# Patient Record
Sex: Female | Born: 1980
Health system: Southern US, Community
[De-identification: ages and names within clinical notes are randomized; demographics above are authoritative.]

## PROBLEM LIST (undated history)

## (undated) DIAGNOSIS — F419 Anxiety disorder, unspecified: Secondary | ICD-10-CM

## (undated) DIAGNOSIS — F32A Depression, unspecified: Secondary | ICD-10-CM

## (undated) DIAGNOSIS — Z789 Other specified health status: Secondary | ICD-10-CM

## (undated) DIAGNOSIS — E785 Hyperlipidemia, unspecified: Secondary | ICD-10-CM

## (undated) DIAGNOSIS — F329 Major depressive disorder, single episode, unspecified: Secondary | ICD-10-CM

## (undated) DIAGNOSIS — T7491XA Unspecified adult maltreatment, confirmed, initial encounter: Secondary | ICD-10-CM

## (undated) DIAGNOSIS — E079 Disorder of thyroid, unspecified: Secondary | ICD-10-CM

## (undated) DIAGNOSIS — B977 Papillomavirus as the cause of diseases classified elsewhere: Secondary | ICD-10-CM

## (undated) HISTORY — DX: Depression, unspecified: F32.A

## (undated) HISTORY — DX: Other specified health status: Z78.9

## (undated) HISTORY — DX: Anxiety disorder, unspecified: F41.9

## (undated) HISTORY — DX: Papillomavirus as the cause of diseases classified elsewhere: B97.7

## (undated) HISTORY — DX: Major depressive disorder, single episode, unspecified: F32.9

## (undated) HISTORY — DX: Hyperlipidemia, unspecified: E78.5

## (undated) HISTORY — DX: Disorder of thyroid, unspecified: E07.9

## (undated) HISTORY — PX: NO PAST SURGERIES: SHX2092

## (undated) HISTORY — DX: Unspecified adult maltreatment, confirmed, initial encounter: T74.91XA

---

## 2001-07-28 ENCOUNTER — Other Ambulatory Visit: Admission: RE | Admit: 2001-07-28 | Discharge: 2001-07-28 | Payer: Self-pay | Admitting: Family Medicine

## 2001-11-10 ENCOUNTER — Other Ambulatory Visit: Admission: RE | Admit: 2001-11-10 | Discharge: 2001-11-10 | Payer: Self-pay | Admitting: Obstetrics & Gynecology

## 2001-12-29 ENCOUNTER — Ambulatory Visit (HOSPITAL_COMMUNITY): Admission: RE | Admit: 2001-12-29 | Discharge: 2001-12-29 | Payer: Self-pay | Admitting: Obstetrics & Gynecology

## 2002-01-11 ENCOUNTER — Emergency Department (HOSPITAL_COMMUNITY): Admission: EM | Admit: 2002-01-11 | Discharge: 2002-01-11 | Payer: Self-pay | Admitting: Emergency Medicine

## 2002-07-18 ENCOUNTER — Other Ambulatory Visit: Admission: RE | Admit: 2002-07-18 | Discharge: 2002-07-18 | Payer: Self-pay | Admitting: Obstetrics & Gynecology

## 2002-07-31 ENCOUNTER — Ambulatory Visit (HOSPITAL_COMMUNITY): Admission: RE | Admit: 2002-07-31 | Discharge: 2002-07-31 | Payer: Self-pay | Admitting: Obstetrics & Gynecology

## 2003-02-22 ENCOUNTER — Ambulatory Visit (HOSPITAL_COMMUNITY): Admission: RE | Admit: 2003-02-22 | Discharge: 2003-02-22 | Payer: Self-pay | Admitting: Family Medicine

## 2005-04-16 ENCOUNTER — Ambulatory Visit (HOSPITAL_COMMUNITY): Admission: RE | Admit: 2005-04-16 | Discharge: 2005-04-16 | Payer: Self-pay | Admitting: Family Medicine

## 2008-03-22 ENCOUNTER — Ambulatory Visit: Payer: Self-pay | Admitting: Psychiatry

## 2008-03-22 ENCOUNTER — Inpatient Hospital Stay (HOSPITAL_COMMUNITY): Admission: RE | Admit: 2008-03-22 | Discharge: 2008-03-25 | Payer: Self-pay | Admitting: Psychiatry

## 2008-03-22 ENCOUNTER — Emergency Department (HOSPITAL_COMMUNITY): Admission: EM | Admit: 2008-03-22 | Discharge: 2008-03-22 | Payer: Self-pay | Admitting: Emergency Medicine

## 2009-03-27 ENCOUNTER — Other Ambulatory Visit: Admission: RE | Admit: 2009-03-27 | Discharge: 2009-03-27 | Payer: Self-pay | Admitting: Obstetrics & Gynecology

## 2010-03-31 ENCOUNTER — Other Ambulatory Visit: Payer: Self-pay | Admitting: Obstetrics & Gynecology

## 2010-03-31 ENCOUNTER — Other Ambulatory Visit (HOSPITAL_COMMUNITY)
Admission: RE | Admit: 2010-03-31 | Discharge: 2010-03-31 | Disposition: A | Discharge status: Home / Self Care | Payer: BC Managed Care – PPO | Source: Ambulatory Visit | Attending: Obstetrics & Gynecology | Admitting: Obstetrics & Gynecology

## 2010-03-31 DIAGNOSIS — Z113 Encounter for screening for infections with a predominantly sexual mode of transmission: Secondary | ICD-10-CM | POA: Insufficient documentation

## 2010-03-31 DIAGNOSIS — Z01419 Encounter for gynecological examination (general) (routine) without abnormal findings: Secondary | ICD-10-CM | POA: Insufficient documentation

## 2010-04-30 LAB — BASIC METABOLIC PANEL
BUN: 11 mg/dL (ref 6–23)
Calcium: 9 mg/dL (ref 8.4–10.5)
Creatinine, Ser: 0.46 mg/dL (ref 0.4–1.2)
GFR calc non Af Amer: >60 mL/min (ref >60)

## 2010-04-30 LAB — TSH: TSH: 0.004 u[IU]/mL — ABNORMAL LOW (ref 0.350–4.500)

## 2010-04-30 LAB — URINALYSIS, ROUTINE W REFLEX MICROSCOPIC
Nitrite: NEGATIVE
Specific Gravity, Urine: 1.029 (ref 1.005–1.030)
Urobilinogen, UA: 1 mg/dL (ref 0.0–1.0)

## 2010-04-30 LAB — ETHANOL: Alcohol, Ethyl (B): <5 mg/dL (ref 0–10)

## 2010-04-30 LAB — PREGNANCY, URINE: Preg Test, Ur: NEGATIVE

## 2010-04-30 LAB — CBC
Platelets: 172 10*3/uL (ref 150–400)
WBC: 6.7 10*3/uL (ref 4.0–10.5)

## 2010-04-30 LAB — RAPID URINE DRUG SCREEN, HOSP PERFORMED
Opiates: NOT DETECTED
Tetrahydrocannabinol: NOT DETECTED

## 2010-04-30 LAB — T3, FREE: T3, Free: 15.8 pg/mL — ABNORMAL HIGH (ref 2.3–4.2)

## 2010-06-02 NOTE — H&P (Signed)
Anne [name], [name] NO.:  [id number]   MEDICAL RECORD NO.:  [id number]          PATIENT TYPE:  IPS   LOCATION:  0500                          FACILITY:  [facility]   PHYSICIAN:  Geoffery Lyons, M.D.      DATE OF BIRTH:  May 16, 1980   DATE OF ADMISSION:  03/22/2008  DATE OF DISCHARGE:                       PSYCHIATRIC ADMISSION ASSESSMENT   This is a voluntary admission to the services of Dr. Geoffery Lyons.   IDENTIFYING INFORMATION:  This is a 30 year old, single, white female.  She presented to the emergency department at [facility]  yesterday.  She reported that she felt suicidal.  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  She  was told that she does not know the material well enough.  She was  distraught and suicidal because of so many financial obligations.  She  denies using drugs or alcohol.  She has not been sick recently.  She has  been treated in the past for depression.  She states that last year she  was treated at the [facility] at [facility] and took Wellbutrin for  about 4 months.  She recently went back to her primary care physician,  who restarted the Wellbutrin on March 4th.   PAST PSYCHIATRIC HISTORY:  She states that her first bout of depression  was approximately 9 years ago.  She contracted genital warts, was  treated, and that resolved.  Her second bout was last year at [facility] when  she was treated with Wellbutrin, although she states she only took it  for about 4 months because she was feeling better.  Recently, she has  become depressed, anxious with crying spells, poor sleep, excessive  worry.  She states she actually stopped taking the Wellbutrin 9 months  ago, and today she does appear to have an enlarged thyroid.  We will  check her thyroid.   SOCIAL HISTORY:  She quit school she says about a week ago at [employer].  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   FAMILY HISTORY:  She states her half-sister and half-brother, their  common parent is their father, are both schizophrenic.  She states at  least she does not hear voices.   ALCOHOL AND DRUG HISTORY:  She denies.   MEDICAL HISTORY:  Significant for having been treated for genital warts  about 9 years ago.   PRIMARY CARE Anne [name]:  Anne Ruths, MD.   MEDICATIONS:  Her Wellbutrin-XL 150 mg was just restarted on the 4th.   DRUG ALLERGIES:  She has no known drug allergies.   POSITIVE PHYSICAL FINDINGS:  She was medically cleared in the ED at  [facility].  Her CBC was normal.  Her UA was normal.  She had no  alcohol.  Her urine drug screen was positive for benzos, but she has  recently been prescribed some Xanax.  She had no other lab studies.  We  will check her thyroid.  Her vital signs  on admission to the unit show  that she is 5 feet 3, weighs 154, pounds, temperature is 97, blood  pressure is 130/78, pulse is 100, respirations 18.   MENTAL STATUS EXAM:  Today, she is alert and oriented.  She was casually  groomed in her pajamas and a hospital gown. She needs to take a shower  and kind of fix up.  Her mood is anxious and labile and she cries  easily.  Her thought processes are somewhat clear, rational, and goal  oriented.  She is uncomfortable being here.  She feels guilty over  having checked herself in; however, she does acknowledge that she would  like help.  Judgment and insight are fair.  Concentration and memory are  intact.  She denies being actively suicidal or homicidal today.  She  emphatically denies having had auditory or visual hallucinations.   DIAGNOSES:   AXIS I:  1. Anxiety disorder, not otherwise specified.  2. Depressive disorder, not otherwise specified.  3. Rule out prodrome to schizophrenia.  She does have a half-sister      and brother, who are schizophrenic.   AXIS II:  Deferred.   AXIS III:  Rule out thyroid  disease.   AXIS IV:  Severe issues with economics and her Master's degree.   AXIS V:  Thirty-five.   The plan today is to increase the Wellbutrin to XL 300 mg p.o. daily.  We will change the Trazodone to Seroquel to help her sleep better, and  she can also have some during the day for anxiety.  Plus, she may be  prodromal as she does have a half-sister and brother, who are  schizophrenic.   ESTIMATED LENGTH OF STAY:  Three to 5 days, and we will have the case  manager be in touch with the [facility] on Monday.      Mickie Leonarda Salon, P.A.-C.      Geoffery Lyons, M.D.  Electronically Signed    MD/MEDQ  D:  03/23/2008  T:  03/23/2008  Job:  [id number]

## 2010-06-05 NOTE — Op Note (Signed)
   NAME:  Anne [name], Anne [name]                         ACCOUNT NO.:  [id number]   MEDICAL RECORD NO.:  [id number]                   PATIENT TYPE:  AMB   LOCATION:  DAY                                  FACILITY:  [facility]   PHYSICIAN:  Lazaro Arms, M.D.                DATE OF BIRTH:  August 14, 1980   DATE OF PROCEDURE:  07/31/2002  DATE OF DISCHARGE:  07/31/2002                                 OPERATIVE REPORT   PREOPERATIVE DIAGNOSES:  1. Recurrent cervical and vaginal condylomata.  2. Marked high-grade dysplasia on cervical biopsy.   POSTOPERATIVE DIAGNOSES:  1. Recurrent cervical and vaginal condylomata.  2. Marked high-grade dysplasia on cervical biopsy.   PROCEDURE:  Holmium laser of the cervix and vagina.   SURGEON:  Lazaro Arms, M.D.   ANESTHESIA:  Laryngeal mask airway.   FINDINGS:  The patient underwent a laser of the vulva, vagina, and cervix in  December of last year.  She had a Pap smear that subsequently came back  abnormal once again, and she was found to have recurrent condyloma of the  vagina and cervix as well as marked high-grade dysplasia on biopsy.  We then  decided to perform another laser of the vagina and cervix.   DESCRIPTION OF OPERATION:  The patient was taken to the operating room,  placed in a supine position, underwent laryngeal mask airway, placed in  dorsal lithotomy position.  She was prepped and draped in the usual sterile  fashion.  Acetic acid was used.  The holmium laser was used.  An ablation of  all the condyloma of the vagina and cervix was performed, superior and  anterior depth of 5 mm in a conical fashion on the cervix and individual  lesions were ablated as well.  I looked laboriously for any other condyloma,  and none were seen.  She did not have any of the external genitalia either.  She had a lot back in December.  The patient tolerated the procedure well.  There was minimal blood loss.  She was taken to the recovery room in good  and  stable condition.  All counts were correct.                                               Lazaro Arms, M.D.    Loraine Maple  D:  08/14/2002  T:  08/14/2002  Job:  [id number]

## 2010-06-05 NOTE — Discharge Summary (Signed)
Anne [name], Anne [name] NO.:  [id number]   MEDICAL RECORD NO.:  [id number]          PATIENT TYPE:  IPS   LOCATION:  0500                          FACILITY:  [facility]   PHYSICIAN:  Geoffery Lyons, M.D.      DATE OF BIRTH:  May 09, 1980   DATE OF ADMISSION:  03/22/2008  DATE OF DISCHARGE:  03/25/2008                               DISCHARGE SUMMARY   CHIEF COMPLAINT:  This was the first admission to [facility] for this 30 year old single white female presented to emergency  room reporting that she felt suicidal.  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  She was told that she did not know the  material well enough.  She was distraught and suicidal because of some  any financial obligations.  Denies using drugs or alcohol.  Had been  sick recently.  She had been treated in the past for depression.   PAST PSYCHIATRIC HISTORY:  Was seen at the [facility], took  Wellbutrin for about 4 months.  She recently went back to her primary  care physician to get Wellbutrin restarted.  First bout of depression 9  years prior to this admission.  Took the Wellbutrin for 4 months and  felt better so discontinued it.  Endorsed crying spells, poor sleep,  excessive worry.   SECONDARY HISTORY:  Denies active use of any substances.   MEDICAL HISTORY:  Genital warts about 9 years ago.   MEDICATION:  Wellbutrin XL 150 mg per day, just restarted.   LABORATORY WORKUP:  TSH 0.004, free T4 3.81, free T3 15.7.  CBC within  normal limits.  UA was within normal limits.   MENTAL STATUS EXAMINATION:  Reveals alert cooperative female casually  groomed and dressed and mood is anxious.  Affect is labile.  She cries.  Thought processes are clear, rational goal oriented, uncomfortable being  in the hospital.  Feeling guilty over having checked herself in.  Denies  any active suicidal or homicidal ideas.  There are no delusions,  hallucinations.  Cognition is  well-preserved.   ADMITTING DIAGNOSES:  AXIS I:  Anxiety disorder not otherwise specified.  Depressive disorder not otherwise specified.  AXIS II:  No diagnosis.  AXIS III:  Rule out hyperthyroidism.  AXIS IV:  Moderate.  AXIS V:  On admission 35-40, highest GAF in the last year 70-75.   COURSE IN THE HOSPITAL:  She was admitted.  She was kept on the  Wellbutrin that was increased to XL 300 mg per day.  Endorsed a lot of  worry and debts from school.  She had to quit the program, Master in  education.  She is working part-time.  She was overwhelmed when she came  in but endorsed that she is not anymore.  She felt that she would be  better off being seen on an outpatient basis as she felt very  uncomfortable in the unit.  She was not suicidal or homicidal and did  not have any evidence of psychoses or drug or alcohol withdrawal, we  went ahead and discharged  to outpatient followup.   DISCHARGE DIAGNOSES:  AXIS I:  Major depressive disorder, anxiety  disorder not otherwise specified.  AXIS II:  No diagnosis.  AXIS III:  Hyperthyroidism, rule out.  AXIS IV:  Moderate.  AXIS V:  On discharge 50.   DISCHARGE MEDICATIONS:  1. Wellbutrin XL 200 mg per day.  2. Seroquel 50 mg at bedtime.  3. Xanax 0.5 every 6 hours as needed for anxiety.   FOLLOWUP:  At [facility].      Geoffery Lyons, M.D.  Electronically Signed     IL/MEDQ  D:  04/23/2008  T:  04/23/2008  Job:  [id number]

## 2010-06-05 NOTE — Op Note (Signed)
   NAME:  Anne [name], Anne [name]                         ACCOUNT NO.:  [id number]   MEDICAL RECORD NO.:  [id number]                   PATIENT TYPE:  AMB   LOCATION:  SDC                                  FACILITY:  [facility]   PHYSICIAN:  Lazaro Arms, M.D.                DATE OF BIRTH:  1980/02/18   DATE OF PROCEDURE:  12/29/2001  DATE OF DISCHARGE:                                 OPERATIVE REPORT   PREOPERATIVE DIAGNOSES:  1. Cervical dysplasia.  2. Condyloma of the vagina.  3. Condyloma of the vulva and perianal region.   POSTOPERATIVE DIAGNOSES:  1. Cervical dysplasia.  2. Condyloma of the vagina.  3. Condyloma of the vulva and perianal region.   PROCEDURE:  1. Laser ablation of the cervix.  2. Laser of vaginal condyloma.  3. Laser of vulvar and perianal condyloma.   SURGEON:  Lazaro Arms, M.D.   ANESTHESIA:  1. Laryngeal mask airway general.  2. I placed a pudendal block.   FINDINGS:  The patient had multiple condyloma in the perianal region, on the  fourchette, and on the vulva.  She had some condyloma in the vagina and, of  course, she had cervical dysplasia proven by biopsy.   DESCRIPTION OF OPERATION:  The patient was taken to the operating room,  placed in supine position where she underwent laryngeal mask airway.  She  was placed in the dorsal lithotomy position, draped in the usual sterile  fashion.  Colposcopy was performed and a laser ablation of the cervix was  performed to a depth of 5 mm peripherally and 7 mm centrally.  Hemostasis  was achieved with Monsel's and the laser.  Laser ablation of the vaginal  condyloma was then performed in the usual fashion and the laser ablation  using the handheld was performed of the vulva and perianal region with a  power setting of 10.  A power setting of 20 was used for the cervix and  vagina.  The patient had been given a pudendal block for postoperative pain  10 cc bilaterally of 0.5% Marcaine plain.  The patient was awakened  from  anesthesia, taken to recovery room in good, stable condition.  All counts  correct.                                               Lazaro Arms, M.D.    Loraine Maple  D:  12/29/2001  T:  12/29/2001  Job:  [id number]

## 2010-08-03 ENCOUNTER — Other Ambulatory Visit (HOSPITAL_COMMUNITY): Payer: Self-pay | Admitting: "Endocrinology

## 2010-08-03 DIAGNOSIS — E0590 Thyrotoxicosis, unspecified without thyrotoxic crisis or storm: Secondary | ICD-10-CM

## 2010-08-05 ENCOUNTER — Encounter (HOSPITAL_COMMUNITY): Payer: Self-pay

## 2010-08-05 ENCOUNTER — Encounter (HOSPITAL_COMMUNITY)
Admission: RE | Admit: 2010-08-05 | Discharge: 2010-08-05 | Disposition: A | Discharge status: Home / Self Care | Payer: BC Managed Care – PPO | Source: Ambulatory Visit | Attending: "Endocrinology | Admitting: "Endocrinology

## 2010-08-05 DIAGNOSIS — G4700 Insomnia, unspecified: Secondary | ICD-10-CM | POA: Insufficient documentation

## 2010-08-05 DIAGNOSIS — L659 Nonscarring hair loss, unspecified: Secondary | ICD-10-CM | POA: Insufficient documentation

## 2010-08-05 DIAGNOSIS — E0590 Thyrotoxicosis, unspecified without thyrotoxic crisis or storm: Secondary | ICD-10-CM | POA: Insufficient documentation

## 2010-08-05 MED ORDER — SODIUM IODIDE I 131 CAPSULE
10.0000 | Freq: Once | INTRAVENOUS | Status: AC | PRN
  Administered 2010-08-05: 10 via ORAL

## 2010-08-06 ENCOUNTER — Encounter (HOSPITAL_COMMUNITY)
Admission: RE | Admit: 2010-08-06 | Discharge: 2010-08-06 | Disposition: A | Discharge status: Home / Self Care | Payer: BC Managed Care – PPO | Source: Ambulatory Visit | Attending: "Endocrinology | Admitting: "Endocrinology

## 2010-08-06 MED ORDER — SODIUM PERTECHNETATE TC 99M INJECTION
10.0000 | Freq: Once | INTRAVENOUS | Status: AC | PRN
  Administered 2010-08-06: 9.9 via INTRAVENOUS

## 2010-08-11 ENCOUNTER — Other Ambulatory Visit (HOSPITAL_COMMUNITY): Payer: Self-pay | Admitting: "Endocrinology

## 2010-08-11 DIAGNOSIS — E042 Nontoxic multinodular goiter: Secondary | ICD-10-CM

## 2010-08-25 ENCOUNTER — Ambulatory Visit (HOSPITAL_COMMUNITY)
Admission: RE | Admit: 2010-08-25 | Discharge: 2010-08-25 | Disposition: A | Discharge status: Home / Self Care | Payer: BC Managed Care – PPO | Source: Ambulatory Visit | Attending: "Endocrinology | Admitting: "Endocrinology

## 2010-08-25 DIAGNOSIS — E042 Nontoxic multinodular goiter: Secondary | ICD-10-CM

## 2010-08-25 DIAGNOSIS — E0590 Thyrotoxicosis, unspecified without thyrotoxic crisis or storm: Secondary | ICD-10-CM | POA: Insufficient documentation

## 2010-08-28 ENCOUNTER — Other Ambulatory Visit (HOSPITAL_COMMUNITY): Payer: Self-pay | Admitting: "Endocrinology

## 2010-08-28 DIAGNOSIS — R221 Localized swelling, mass and lump, neck: Secondary | ICD-10-CM

## 2010-09-02 ENCOUNTER — Ambulatory Visit (HOSPITAL_COMMUNITY): Admission: RE | Admit: 2010-09-02 | Payer: BC Managed Care – PPO | Source: Ambulatory Visit

## 2010-09-02 ENCOUNTER — Other Ambulatory Visit (HOSPITAL_COMMUNITY): Payer: Self-pay | Admitting: Diagnostic Radiology

## 2010-09-02 NOTE — Progress Notes (Signed)
09/02/2010 @ 1610 Discussed patient with Dr. Fransico Him.  At observed size of 11mm, the single discrete identified thyroid in the right lobe falls below the size criteria for biopsy, based on the criteria established by the Society of Radiologists in Ultrasound (referrence below).  As the nodule contains no calcifications or worrisome features, will cancel thyroid biopsy and instead proceed with a 37-month f/u US to assess.  Management of Thyroid Nodules Detected as [place]:  Society of Radiologists in Ultrasound Consensus Conference Statement.  Radiology 2005; [id number].  Available online at : [email or web address].[email or web address]/[email or web address].

## 2011-05-11 ENCOUNTER — Other Ambulatory Visit (HOSPITAL_COMMUNITY)
Admission: RE | Admit: 2011-05-11 | Discharge: 2011-05-11 | Disposition: A | Discharge status: Home / Self Care | Payer: BC Managed Care – PPO | Source: Ambulatory Visit | Attending: Obstetrics & Gynecology | Admitting: Obstetrics & Gynecology

## 2011-05-11 ENCOUNTER — Other Ambulatory Visit: Payer: Self-pay | Admitting: Obstetrics & Gynecology

## 2011-05-11 DIAGNOSIS — Z01419 Encounter for gynecological examination (general) (routine) without abnormal findings: Secondary | ICD-10-CM | POA: Insufficient documentation

## 2011-09-28 ENCOUNTER — Other Ambulatory Visit: Payer: Self-pay | Admitting: Obstetrics & Gynecology

## 2012-05-15 ENCOUNTER — Encounter: Payer: Self-pay | Admitting: *Deleted

## 2012-05-16 ENCOUNTER — Encounter: Payer: Self-pay | Admitting: Obstetrics & Gynecology

## 2012-05-16 ENCOUNTER — Ambulatory Visit (INDEPENDENT_AMBULATORY_CARE_PROVIDER_SITE_OTHER): Payer: BC Managed Care – PPO | Admitting: Obstetrics & Gynecology

## 2012-05-16 VITALS — BP 120/80 | Ht 62.0 in | Wt 209.0 lb

## 2012-05-16 DIAGNOSIS — Z01419 Encounter for gynecological examination (general) (routine) without abnormal findings: Secondary | ICD-10-CM

## 2012-05-16 MED ORDER — NORGESTIMATE-ETH ESTRADIOL 0.25-35 MG-MCG PO TABS: 1.0000 | ORAL_TABLET | Freq: Every day | ORAL | Status: DC

## 2012-05-16 NOTE — Patient Instructions (Signed)

## 2012-05-16 NOTE — Progress Notes (Signed)
Patient ID: Anne [name], female   DOB: 07/13/1980, 32 y.o.   MRN: [id number] Subjective:     Anne [name] is a 32 y.o. female here for a routine exam.  Current complaints: none.  Personal health questionnaire reviewed: not asked.   Gynecologic History Patient's last menstrual period was 05/06/2012. Contraception: OCP (estrogen/progesterone) Last Pap: 3.2013. Results were: normal Last mammogram: na. Results were: normal  Obstetric History OB History   Grav Para Term Preterm Abortions TAB SAB Ect Mult Living                   The following portions of the patient's history were reviewed and updated as appropriate: allergies, current medications, past family history, past medical history, past social history, past surgical history and problem list.  Review of Systems  Review of Systems  Constitutional: Negative for fever, chills, weight loss, malaise/fatigue and diaphoresis.  HENT: Negative for hearing loss, ear pain, nosebleeds, congestion, sore throat, neck pain, tinnitus and ear discharge.   Eyes: Negative for blurred vision, double vision, photophobia, pain, discharge and redness.  Respiratory: Negative for cough, hemoptysis, sputum production, shortness of breath, wheezing and stridor.   Cardiovascular: Negative for chest pain, palpitations, orthopnea, claudication, leg swelling and PND.  Gastrointestinal: negative for abdominal pain. Negative for heartburn, nausea, vomiting, diarrhea, constipation, blood in stool and melena.  Genitourinary: Negative for dysuria, urgency, frequency, hematuria and flank pain.  Musculoskeletal: Negative for myalgias, back pain, joint pain and falls.  Skin: Negative for itching and rash.  Neurological: Negative for dizziness, tingling, tremors, sensory change, speech change, focal weakness, seizures, loss of consciousness, weakness and headaches.  Endo/Heme/Allergies: Negative for environmental allergies and polydipsia. Does not bruise/bleed  easily.  Psychiatric/Behavioral: Negative for depression, suicidal ideas, hallucinations, memory loss and substance abuse. The patient is not nervous/anxious and does not have insomnia.        Objective:    Physical Exam  Vitals reviewed. Constitutional: She is oriented to person, place, and time. She appears well-developed and well-nourished.  HENT:  Head: Normocephalic and atraumatic.        Right Ear: External ear normal.  Left Ear: External ear normal.  Nose: Nose normal.  Mouth/Throat: Oropharynx is clear and moist.  Eyes: Conjunctivae and EOM are normal. Pupils are equal, round, and reactive to light. Right eye exhibits no discharge. Left eye exhibits no discharge. No scleral icterus.  Neck: Normal range of motion. Neck supple. No tracheal deviation present. No thyromegaly present.  Cardiovascular: Normal rate, regular rhythm, normal heart sounds and intact distal pulses.  Exam reveals no gallop and no friction rub.   No murmur heard. Respiratory: Effort normal and breath sounds normal. No respiratory distress. She has no wheezes. She has no rales. She exhibits no tenderness.  GI: Soft. Bowel sounds are normal. She exhibits no distension and no mass. There is no tenderness. There is no rebound and no guarding.  Genitourinary:       Vulva is normal without lesions Vagina is pink moist without discharge Cervix normal in appearance and pap is done Uterus is normal size shape and contour Adnexa is negative with normal sized ovaries by sonogram  Musculoskeletal: Normal range of motion. She exhibits no edema and no tenderness.  Neurological: She is alert and oriented to person, place, and time. She has normal reflexes. She displays normal reflexes. No cranial nerve deficit. She exhibits normal muscle tone. Coordination normal.  Skin: Skin is warm and dry. No rash noted. No erythema.  No pallor.  Psychiatric: She has a normal mood and affect. Her behavior is normal. Judgment and thought  content normal.       Assessment:    Healthy female exam.    Plan:  Sprintec  Contraception: OCP (estrogen/progesterone). Follow up in: 1 year.

## 2013-04-27 ENCOUNTER — Other Ambulatory Visit: Payer: Self-pay | Admitting: Obstetrics & Gynecology

## 2013-04-30 ENCOUNTER — Other Ambulatory Visit: Payer: Self-pay | Admitting: Obstetrics & Gynecology

## 2013-05-22 ENCOUNTER — Other Ambulatory Visit (HOSPITAL_COMMUNITY)
Admission: RE | Admit: 2013-05-22 | Discharge: 2013-05-22 | Disposition: A | Discharge status: Home / Self Care | Payer: BC Managed Care – PPO | Source: Ambulatory Visit | Attending: Obstetrics & Gynecology | Admitting: Obstetrics & Gynecology

## 2013-05-22 ENCOUNTER — Ambulatory Visit (INDEPENDENT_AMBULATORY_CARE_PROVIDER_SITE_OTHER): Payer: BC Managed Care – PPO | Admitting: Obstetrics & Gynecology

## 2013-05-22 ENCOUNTER — Other Ambulatory Visit: Payer: Self-pay | Admitting: Obstetrics & Gynecology

## 2013-05-22 ENCOUNTER — Encounter: Payer: Self-pay | Admitting: Obstetrics & Gynecology

## 2013-05-22 VITALS — BP 108/80 | Ht 63.0 in | Wt 212.0 lb

## 2013-05-22 DIAGNOSIS — Z124 Encounter for screening for malignant neoplasm of cervix: Secondary | ICD-10-CM | POA: Insufficient documentation

## 2013-05-22 DIAGNOSIS — R87810 Cervical high risk human papillomavirus (HPV) DNA test positive: Secondary | ICD-10-CM | POA: Insufficient documentation

## 2013-05-22 DIAGNOSIS — N841 Polyp of cervix uteri: Secondary | ICD-10-CM

## 2013-05-22 DIAGNOSIS — Z01419 Encounter for gynecological examination (general) (routine) without abnormal findings: Secondary | ICD-10-CM

## 2013-05-22 MED ORDER — DESOGESTREL-ETHINYL ESTRADIOL 0.15-30 MG-MCG PO TABS: 1.0000 | ORAL_TABLET | Freq: Every day | ORAL | Status: DC

## 2013-05-22 NOTE — Progress Notes (Signed)
Patient ID: Anne Leylandsther M Mitro, female   DOB: October 08, 1980, 33 y.o.   MRN: [id number] Subjective:     Anne [name] is a 33 y.o. female here for a routine exam.  Patient's last menstrual period was 05/02/2013. No obstetric history on file. Birth Control Method:  OCP Menstrual Calendar(currently): a little irregular  Current complaints: irregular bleeding.   Current acute medical issues:  none   Recent Gynecologic History Patient's last menstrual period was 05/02/2013. Last Pap: 2014,  normal Last mammogram: na,    Past Medical History  Diagnosis Date  . Medical history non-contributory     Past Surgical History  Procedure Laterality Date  . No past surgeries      OB History   Grav Para Term Preterm Abortions TAB SAB Ect Mult Living                  History   Social History  . Marital Status: Single    Spouse Name: N/A    Number of Children: N/A  . Years of Education: N/A   Social History Main Topics  . Smoking status: Never Smoker   . Smokeless tobacco: None  . Alcohol Use: No  . Drug Use: No  . Sexual Activity: Yes   Other Topics Concern  . None   Social History Narrative  . None    Family History  Problem Relation Age of Onset  . Hypertension Father   . Heart disease Father      Review of Systems  Review of Systems  Constitutional: Negative for fever, chills, weight loss, malaise/fatigue and diaphoresis.  HENT: Negative for hearing loss, ear pain, nosebleeds, congestion, sore throat, neck pain, tinnitus and ear discharge.   Eyes: Negative for blurred vision, double vision, photophobia, pain, discharge and redness.  Respiratory: Negative for cough, hemoptysis, sputum production, shortness of breath, wheezing and stridor.   Cardiovascular: Negative for chest pain, palpitations, orthopnea, claudication, leg swelling and PND.  Gastrointestinal: negative for abdominal pain. Negative for heartburn, nausea, vomiting, diarrhea, constipation, blood in stool and  melena.  Genitourinary: Negative for dysuria, urgency, frequency, hematuria and flank pain.  Musculoskeletal: Negative for myalgias, back pain, joint pain and falls.  Skin: Negative for itching and rash.  Neurological: Negative for dizziness, tingling, tremors, sensory change, speech change, focal weakness, seizures, loss of consciousness, weakness and headaches.  Endo/Heme/Allergies: Negative for environmental allergies and polydipsia. Does not bruise/bleed easily.  Psychiatric/Behavioral: Negative for depression, suicidal ideas, hallucinations, memory loss and substance abuse. The patient is not nervous/anxious and does not have insomnia.        Objective:    Physical Exam  Vitals reviewed. Constitutional: She is oriented to person, place, and time. She appears well-developed and well-nourished.  HENT:  Head: Normocephalic and atraumatic.        Right Ear: External ear normal.  Left Ear: External ear normal.  Nose: Nose normal.  Mouth/Throat: Oropharynx is clear and moist.  Eyes: Conjunctivae and EOM are normal. Pupils are equal, round, and reactive to light. Right eye exhibits no discharge. Left eye exhibits no discharge. No scleral icterus.  Neck: Normal range of motion. Neck supple. No tracheal deviation present. No thyromegaly present.  Cardiovascular: Normal rate, regular rhythm, normal heart sounds and intact distal pulses.  Exam reveals no gallop and no friction rub.   No murmur heard. Respiratory: Effort normal and breath sounds normal. No respiratory distress. She has no wheezes. She has no rales. She exhibits no tenderness.  GI: Soft.  Bowel sounds are normal. She exhibits no distension and no mass. There is no tenderness. There is no rebound and no guarding.  Genitourinary:  Breasts no masses skin changes or nipple changes bilaterally      Vulva is normal without lesions Vagina is pink moist without discharge Cervix polyp and pap is done, polyp is removed Uterus is normal  size shape and contour Adnexa is negative with normal sized ovaries    Musculoskeletal: Normal range of motion. She exhibits no edema and no tenderness.  Neurological: She is alert and oriented to person, place, and time. She has normal reflexes. She displays normal reflexes. No cranial nerve deficit. She exhibits normal muscle tone. Coordination normal.  Skin: Skin is warm and dry. No rash noted. No erythema. No pallor.  Psychiatric: She has a normal mood and affect. Her behavior is normal. Judgment and thought content normal.       Assessment:    Healthy female exam.    Plan:    Contraception: OCP (estrogen/progesterone). Follow up in: 1 year. polyp removed, change to desogestrel OCP

## 2014-04-19 ENCOUNTER — Other Ambulatory Visit: Payer: Self-pay | Admitting: Obstetrics & Gynecology

## 2014-06-20 ENCOUNTER — Other Ambulatory Visit: Payer: Self-pay | Admitting: Obstetrics & Gynecology

## 2014-07-02 ENCOUNTER — Encounter: Payer: Self-pay | Admitting: Obstetrics & Gynecology

## 2014-07-02 ENCOUNTER — Ambulatory Visit (INDEPENDENT_AMBULATORY_CARE_PROVIDER_SITE_OTHER): Payer: BLUE CROSS/BLUE SHIELD | Admitting: Obstetrics & Gynecology

## 2014-07-02 ENCOUNTER — Other Ambulatory Visit (HOSPITAL_COMMUNITY)
Admission: RE | Admit: 2014-07-02 | Discharge: 2014-07-02 | Disposition: A | Discharge status: Home / Self Care | Payer: BLUE CROSS/BLUE SHIELD | Source: Ambulatory Visit | Attending: Obstetrics & Gynecology | Admitting: Obstetrics & Gynecology

## 2014-07-02 VITALS — BP 120/80 | HR 74 | Ht 63.0 in | Wt 225.0 lb

## 2014-07-02 DIAGNOSIS — Z01419 Encounter for gynecological examination (general) (routine) without abnormal findings: Secondary | ICD-10-CM | POA: Insufficient documentation

## 2014-07-02 NOTE — Progress Notes (Signed)
Patient ID: Anne [name], female   DOB: August 15, 1980, 34 y.o.   MRN: [id number] Subjective:     Anne [name] is a 34 y.o. female here for a routine exam.  Patient's last menstrual period was 06/28/2014. No obstetric history on file. Birth Control Method:  Desogestrel with 30 mics EE Menstrual Calendar(currently): regular  Current complaints:  none.   Current acute medical issues:  none   Recent Gynecologic History Patient's last menstrual period was 06/28/2014. Last Pap: 2015,  normal Last mammogram: ,    Past Medical History  Diagnosis Date  . Medical history non-contributory   . Hyperlipidemia     Past Surgical History  Procedure Laterality Date  . No past surgeries      OB History    No data available      History   Social History  . Marital Status: Single    Spouse Name: N/A  . Number of Children: N/A  . Years of Education: N/A   Social History Main Topics  . Smoking status: Never Smoker   . Smokeless tobacco: Not on file  . Alcohol Use: No  . Drug Use: No  . Sexual Activity: Yes   Other Topics Concern  . None   Social History Narrative    Family History  Problem Relation Age of Onset  . Hypertension Father   . Heart disease Father   . Diabetes Father   . Hypercholesterolemia Father   . Stroke Father      Current outpatient prescriptions:  .  SPRINTEC 28 0.25-35 MG-MCG tablet, TAKE 1 TABLET BY MOUTH DAILY., Disp: 28 tablet, Rfl: 11 .  APRI 0.15-30 MG-MCG tablet, TAKE 1 TABLET BY MOUTH DAILY., Disp: 28 tablet, Rfl: 11 .  SPRINTEC 28 0.25-35 MG-MCG tablet, TAKE 1 TABLET BY MOUTH DAILY. (Patient not taking: Reported on 07/02/2014), Disp: 28 tablet, Rfl: 11  Review of Systems  Review of Systems  Constitutional: Negative for fever, chills, weight loss, malaise/fatigue and diaphoresis.  HENT: Negative for hearing loss, ear pain, nosebleeds, congestion, sore throat, neck pain, tinnitus and ear discharge.   Eyes: Negative for blurred vision, double  vision, photophobia, pain, discharge and redness.  Respiratory: Negative for cough, hemoptysis, sputum production, shortness of breath, wheezing and stridor.   Cardiovascular: Negative for chest pain, palpitations, orthopnea, claudication, leg swelling and PND.  Gastrointestinal: negative for abdominal pain. Negative for heartburn, nausea, vomiting, diarrhea, constipation, blood in stool and melena.  Genitourinary: Negative for dysuria, urgency, frequency, hematuria and flank pain.  Musculoskeletal: Negative for myalgias, back pain, joint pain and falls.  Skin: Negative for itching and rash.  Neurological: Negative for dizziness, tingling, tremors, sensory change, speech change, focal weakness, seizures, loss of consciousness, weakness and headaches.  Endo/Heme/Allergies: Negative for environmental allergies and polydipsia. Does not bruise/bleed easily.  Psychiatric/Behavioral: Negative for depression, suicidal ideas, hallucinations, memory loss and substance abuse. The patient is not nervous/anxious and does not have insomnia.        Objective:  Blood pressure 120/80, pulse 74, height  (1.6 m), weight 225 lb (102.059 kg), last menstrual period 06/28/2014.   Physical Exam  Vitals reviewed. Constitutional: She is oriented to person, place, and time. She appears well-developed and well-nourished.  HENT:  Head: Normocephalic and atraumatic.        Right Ear: External ear normal.  Left Ear: External ear normal.  Nose: Nose normal.  Mouth/Throat: Oropharynx is clear and moist.  Eyes: Conjunctivae and EOM are normal. Pupils are equal, round,  and reactive to light. Right eye exhibits no discharge. Left eye exhibits no discharge. No scleral icterus.  Neck: Normal range of motion. Neck supple. No tracheal deviation present. No thyromegaly present.  Cardiovascular: Normal rate, regular rhythm, normal heart sounds and intact distal pulses.  Exam reveals no gallop and no friction rub.   No murmur  heard. Respiratory: Effort normal and breath sounds normal. No respiratory distress. She has no wheezes. She has no rales. She exhibits no tenderness.  GI: Soft. Bowel sounds are normal. She exhibits no distension and no mass. There is no tenderness. There is no rebound and no guarding.  Genitourinary:  Breasts no masses skin changes or nipple changes bilaterally      Vulva is normal without lesions Vagina is pink moist without discharge Cervix normal in appearance and pap is done Uterus is normal size shape and contour Adnexa is negative with normal sized ovaries   Musculoskeletal: Normal range of motion. She exhibits no edema and no tenderness.  Neurological: She is alert and oriented to person, place, and time. She has normal reflexes. She displays normal reflexes. No cranial nerve deficit. She exhibits normal muscle tone. Coordination normal.  Skin: Skin is warm and dry. No rash noted. No erythema. No pallor.  Psychiatric: She has a normal mood and affect. Her behavior is normal. Judgment and thought content normal.       Assessment:    Healthy female exam.    Plan:    Contraception: OCP (estrogen/progesterone). Follow up in: 1 year.

## 2014-07-05 LAB — CYTOLOGY - PAP

## 2015-03-20 ENCOUNTER — Other Ambulatory Visit: Payer: Self-pay | Admitting: Obstetrics & Gynecology

## 2015-07-16 ENCOUNTER — Other Ambulatory Visit (HOSPITAL_COMMUNITY)
Admission: RE | Admit: 2015-07-16 | Discharge: 2015-07-16 | Disposition: A | Discharge status: Home / Self Care | Payer: Managed Care, Other (non HMO) | Source: Ambulatory Visit | Attending: Obstetrics & Gynecology | Admitting: Obstetrics & Gynecology

## 2015-07-16 ENCOUNTER — Ambulatory Visit (INDEPENDENT_AMBULATORY_CARE_PROVIDER_SITE_OTHER): Payer: Managed Care, Other (non HMO) | Admitting: Obstetrics & Gynecology

## 2015-07-16 ENCOUNTER — Encounter: Payer: Self-pay | Admitting: Obstetrics & Gynecology

## 2015-07-16 VITALS — BP 140/90 | HR 74 | Ht 63.0 in | Wt 208.0 lb

## 2015-07-16 DIAGNOSIS — Z01419 Encounter for gynecological examination (general) (routine) without abnormal findings: Secondary | ICD-10-CM

## 2015-07-16 MED ORDER — DESOGESTREL-ETHINYL ESTRADIOL 0.15-30 MG-MCG PO TABS: 1.0000 | ORAL_TABLET | Freq: Every day | ORAL | Status: DC

## 2015-07-16 NOTE — Progress Notes (Signed)
Patient ID: Anne Leylandsther M Pai, female   DOB: [birth year], 35 y.o.   MRN: [id number] Subjective:     Anne [name] is a 35 y.o. female here for a routine exam.  Patient's last menstrual period was 06/25/2015. No obstetric history on file. Birth Control Method:  OCP Menstrual Calendar(currently): regular  Current complaints: none.   Current acute medical issues:  none   Recent Gynecologic History Patient's last menstrual period was 06/25/2015. Last Pap: 2016,  normal Last mammogram: ,    Past Medical History  Diagnosis Date  . Medical history non-contributory   . Hyperlipidemia     Past Surgical History  Procedure Laterality Date  . No past surgeries      OB History    No data available      Social History   Social History  . Marital Status: Single    Spouse Name: N/A  . Number of Children: N/A  . Years of Education: N/A   Social History Main Topics  . Smoking status: Never Smoker   . Smokeless tobacco: None  . Alcohol Use: No  . Drug Use: No  . Sexual Activity: Yes   Other Topics Concern  . None   Social History Narrative    Family History  Problem Relation Age of Onset  . Hypertension Father   . Heart disease Father   . Diabetes Father   . Hypercholesterolemia Father   . Stroke Father      Current outpatient prescriptions:  .  desogestrel-ethinyl estradiol (APRI) 0.15-30 MG-MCG tablet, Take 1 tablet by mouth daily., Disp: 28 tablet, Rfl: 11  Review of Systems  Review of Systems  Constitutional: Negative for fever, chills, weight loss, malaise/fatigue and diaphoresis.  HENT: Negative for hearing loss, ear pain, nosebleeds, congestion, sore throat, neck pain, tinnitus and ear discharge.   Eyes: Negative for blurred vision, double vision, photophobia, pain, discharge and redness.  Respiratory: Negative for cough, hemoptysis, sputum production, shortness of breath, wheezing and stridor.   Cardiovascular: Negative for chest pain, palpitations,  orthopnea, claudication, leg swelling and PND.  Gastrointestinal: negative for abdominal pain. Negative for heartburn, nausea, vomiting, diarrhea, constipation, blood in stool and melena.  Genitourinary: Negative for dysuria, urgency, frequency, hematuria and flank pain.  Musculoskeletal: Negative for myalgias, back pain, joint pain and falls.  Skin: Negative for itching and rash.  Neurological: Negative for dizziness, tingling, tremors, sensory change, speech change, focal weakness, seizures, loss of consciousness, weakness and headaches.  Endo/Heme/Allergies: Negative for environmental allergies and polydipsia. Does not bruise/bleed easily.  Psychiatric/Behavioral: Negative for depression, suicidal ideas, hallucinations, memory loss and substance abuse. The patient is not nervous/anxious and does not have insomnia.        Objective:  Blood pressure 140/90, pulse 74, height 5\' 3"  (1.6 m), weight 208 lb (94.348 kg), last menstrual period 06/25/2015.   Physical Exam  Vitals reviewed. Constitutional: She is oriented to person, place, and time. She appears well-developed and well-nourished.  HENT:  Head: Normocephalic and atraumatic.        Right Ear: External ear normal.  Left Ear: External ear normal.  Nose: Nose normal.  Mouth/Throat: Oropharynx is clear and moist.  Eyes: Conjunctivae and EOM are normal. Pupils are equal, round, and reactive to light. Right eye exhibits no discharge. Left eye exhibits no discharge. No scleral icterus.  Neck: Normal range of motion. Neck supple. No tracheal deviation present. No thyromegaly present.  Cardiovascular: Normal rate, regular rhythm, normal heart sounds and intact distal pulses.  Exam reveals no gallop and no friction rub.   No murmur heard. Respiratory: Effort normal and breath sounds normal. No respiratory distress. She has no wheezes. She has no rales. She exhibits no tenderness.  GI: Soft. Bowel sounds are normal. She exhibits no distension  and no mass. There is no tenderness. There is no rebound and no guarding.  Genitourinary:  Breasts no masses skin changes or nipple changes bilaterally      Vulva is normal without lesions Vagina is pink moist without discharge Cervix normal in appearance and pap is done Uterus is normal size shape and contour Adnexa is negative with normal sized ovaries   Musculoskeletal: Normal range of motion. She exhibits no edema and no tenderness.  Neurological: She is alert and oriented to person, place, and time. She has normal reflexes. She displays normal reflexes. No cranial nerve deficit. She exhibits normal muscle tone. Coordination normal.  Skin: Skin is warm and dry. No rash noted. No erythema. No pallor.  Psychiatric: She has a normal mood and affect. Her behavior is normal. Judgment and thought content normal.       Medications Ordered at today's visit: Meds ordered this encounter  Medications  . desogestrel-ethinyl estradiol (APRI) 0.15-30 MG-MCG tablet    Sig: Take 1 tablet by mouth daily.    Dispense:  28 tablet    Refill:  11    Other orders placed at today's visit: No orders of the defined types were placed in this encounter.      Assessment:    Healthy female exam.    Plan:    Contraception: OCP (estrogen/progesterone). Follow up in: 1 year.     Return in about 1 year (around 07/15/2016) for yearly, with Dr Despina HiddenEure.

## 2015-07-21 LAB — CYTOLOGY - PAP

## 2015-12-30 ENCOUNTER — Other Ambulatory Visit: Payer: Self-pay | Admitting: *Deleted

## 2015-12-30 MED ORDER — DESOGESTREL-ETHINYL ESTRADIOL 0.15-30 MG-MCG PO TABS: 1.0000 | ORAL_TABLET | Freq: Every day | ORAL | 3 refills | Status: DC

## 2016-08-24 ENCOUNTER — Encounter: Payer: Self-pay | Admitting: Obstetrics & Gynecology

## 2016-08-24 ENCOUNTER — Other Ambulatory Visit (HOSPITAL_COMMUNITY)
Admission: RE | Admit: 2016-08-24 | Discharge: 2016-08-24 | Disposition: A | Discharge status: Home / Self Care | Payer: Managed Care, Other (non HMO) | Source: Ambulatory Visit | Attending: Obstetrics & Gynecology | Admitting: Obstetrics & Gynecology

## 2016-08-24 ENCOUNTER — Ambulatory Visit (INDEPENDENT_AMBULATORY_CARE_PROVIDER_SITE_OTHER): Payer: Managed Care, Other (non HMO) | Admitting: Obstetrics & Gynecology

## 2016-08-24 VITALS — BP 170/100 | HR 85 | Ht 63.0 in | Wt 230.0 lb

## 2016-08-24 DIAGNOSIS — Z01419 Encounter for gynecological examination (general) (routine) without abnormal findings: Secondary | ICD-10-CM

## 2016-08-24 MED ORDER — PODOFILOX 0.5 % EX SOLN: Freq: Two times a day (BID) | CUTANEOUS | 11 refills | Status: DC

## 2016-08-24 MED ORDER — NORETHIN ACE-ETH ESTRAD-FE 1-20 MG-MCG PO TABS: 1.0000 | ORAL_TABLET | Freq: Every day | ORAL | 11 refills | Status: DC

## 2016-08-24 NOTE — Progress Notes (Signed)
Subjective:     Anne [name] is a 36 y.o. female here for a routine exam.  Patient's last menstrual period was 08/19/2016 (approximate). [id number] Birth Control Method:  OCP Menstrual Calendar(currently): regular  Current complaints: none.   Current acute medical issues:  Hx of HSIL of the cervix   Recent Gynecologic History Patient's last menstrual period was 08/19/2016 (approximate). Last Pap: 2017,  normal Last mammogram: ,    Past Medical History:  Diagnosis Date  . Anxiety   . Depression   . HPV (human papilloma virus) infection   . Hyperlipidemia   . Medical history non-contributory   . Thyroid disease     Past Surgical History:  Procedure Laterality Date  . NO PAST SURGERIES      OB History    Gravida Para Term Preterm AB Living   0 0 0 0 0 0   SAB TAB Ectopic Multiple Live Births   0 0 0 0 0      Social History   Socioeconomic History  . Marital status: Single    Spouse name: None  . Number of children: None  . Years of education: None  . Highest education level: None  Social Needs  . Financial resource strain: None  . Food insecurity - worry: None  . Food insecurity - inability: None  . Transportation needs - medical: None  . Transportation needs - non-medical: None  Occupational History  . None  Tobacco Use  . Smoking status: Never Smoker  . Smokeless tobacco: Never Used  Substance and Sexual Activity  . Alcohol use: No  . Drug use: No  . Sexual activity: Yes    Birth control/protection: Pill  Other Topics Concern  . None  Social History Narrative  . None    Family History  Problem Relation Age of Onset  . Hypertension Father   . Heart disease Father   . Diabetes Father   . Hypercholesterolemia Father   . Stroke Father   . Other Maternal Grandmother        hole in heart  . Other Maternal Grandfather        MVA  . Thyroid disease Mother   . Other Brother        house fire  . Cancer Sister        skin  . Schizophrenia Sister    . Alzheimer's disease Sister   . Kidney Stones Sister      Current Outpatient Medications:  .  ALPRAZolam (XANAX) 0.5 MG tablet, Take 0.5 mg by mouth as needed. , Disp: , Rfl:  .  atorvastatin (LIPITOR) 20 MG tablet, Take 20 mg by mouth daily. , Disp: , Rfl:  .  buPROPion (WELLBUTRIN XL) 150 MG 24 hr tablet, Take 150 mg by mouth daily. , Disp: , Rfl:  .  desogestrel-ethinyl estradiol (APRI) 0.15-30 MG-MCG tablet, Take 1 tablet by mouth daily., Disp: 90 tablet, Rfl: 3 .  escitalopram (LEXAPRO) 10 MG tablet, Take 10 mg by mouth daily. , Disp: , Rfl:  .  levothyroxine (SYNTHROID, LEVOTHROID) 50 MCG tablet, Take 50 mcg by mouth daily before breakfast., Disp: , Rfl:  .  norethindrone-ethinyl estradiol (JUNEL FE,GILDESS FE,LOESTRIN FE) 1-20 MG-MCG tablet, Take 1 tablet by mouth daily., Disp: 3 Package, Rfl: 4 .  podofilox (CONDYLOX) 0.5 % external solution, Apply topically 2 (two) times daily., Disp: 3.5 mL, Rfl: 11  Review of Systems  Review of Systems  Constitutional: Negative for fever, chills, weight loss, malaise/fatigue and  diaphoresis.  HENT: Negative for hearing loss, ear pain, nosebleeds, congestion, sore throat, neck pain, tinnitus and ear discharge.   Eyes: Negative for blurred vision, double vision, photophobia, pain, discharge and redness.  Respiratory: Negative for cough, hemoptysis, sputum production, shortness of breath, wheezing and stridor.   Cardiovascular: Negative for chest pain, palpitations, orthopnea, claudication, leg swelling and PND.  Gastrointestinal: negative for abdominal pain. Negative for heartburn, nausea, vomiting, diarrhea, constipation, blood in stool and melena.  Genitourinary: Negative for dysuria, urgency, frequency, hematuria and flank pain.  Musculoskeletal: Negative for myalgias, back pain, joint pain and falls.  Skin: Negative for itching and rash.  Neurological: Negative for dizziness, tingling, tremors, sensory change, speech change, focal weakness,  seizures, loss of consciousness, weakness and headaches.  Endo/Heme/Allergies: Negative for environmental allergies and polydipsia. Does not bruise/bleed easily.  Psychiatric/Behavioral: Negative for depression, suicidal ideas, hallucinations, memory loss and substance abuse. The patient is not nervous/anxious and does not have insomnia.        Objective:  Blood pressure (!) 170/100, pulse 85, height 5\' 3"  (1.6 m), weight 230 lb (104.3 kg), last menstrual period 08/19/2016.   Physical Exam  Vitals reviewed. Constitutional: She is oriented to person, place, and time. She appears well-developed and well-nourished.  HENT:  Head: Normocephalic and atraumatic.        Right Ear: External ear normal.  Left Ear: External ear normal.  Nose: Nose normal.  Mouth/Throat: Oropharynx is clear and moist.  Eyes: Conjunctivae and EOM are normal. Pupils are equal, round, and reactive to light. Right eye exhibits no discharge. Left eye exhibits no discharge. No scleral icterus.  Neck: Normal range of motion. Neck supple. No tracheal deviation present. No thyromegaly present.  Cardiovascular: Normal rate, regular rhythm, normal heart sounds and intact distal pulses.  Exam reveals no gallop and no friction rub.   No murmur heard. Respiratory: Effort normal and breath sounds normal. No respiratory distress. She has no wheezes. She has no rales. She exhibits no tenderness.  GI: Soft. Bowel sounds are normal. She exhibits no distension and no mass. There is no tenderness. There is no rebound and no guarding.  Genitourinary:  Breasts no masses skin changes or nipple changes bilaterally      Vulva is normal without lesions Vagina is pink moist without discharge Cervix normal in appearance and pap is done Uterus is normal size shape and contour Adnexa is negative with normal sized ovaries   Musculoskeletal: Normal range of motion. She exhibits no edema and no tenderness.  Neurological: She is alert and oriented  to person, place, and time. She has normal reflexes. She displays normal reflexes. No cranial nerve deficit. She exhibits normal muscle tone. Coordination normal.  Skin: Skin is warm and dry. No rash noted. No erythema. No pallor.  Psychiatric: She has a normal mood and affect. Her behavior is normal. Judgment and thought content normal.       Medications Ordered at today's visit: Meds ordered this encounter  Medications  . podofilox (CONDYLOX) 0.5 % external solution    Sig: Apply topically 2 (two) times daily.    Dispense:  3.5 mL    Refill:  11  . DISCONTD: norethindrone-ethinyl estradiol (JUNEL FE,GILDESS FE,LOESTRIN FE) 1-20 MG-MCG tablet    Sig: Take 1 tablet by mouth daily.    Dispense:  1 Package    Refill:  11    Other orders placed at today's visit: No orders of the defined types were placed in this encounter.  Assessment:    Healthy female exam.    Plan:    Contraception: OCP (estrogen/progesterone). Follow up in: 1 year.     Return in about 1 year (around 08/24/2017) for yearly, with Dr Despina HiddenEure.

## 2016-08-27 ENCOUNTER — Telehealth: Payer: Self-pay | Admitting: Obstetrics & Gynecology

## 2016-08-27 NOTE — Telephone Encounter (Signed)
Patient called stating that Dr. Despina HiddenEure has changed her birth control medication and her pharmacy is questioning it. Pt states that her pharmacy is trying to get some information from us. Please contact pt

## 2016-08-27 NOTE — Telephone Encounter (Signed)
Spoke with pt. Pt's insurance company wants a 90 day supply for birth control. Order was faxed to [facility]. JSY

## 2016-08-30 LAB — CYTOLOGY - PAP
Diagnosis: NEGATIVE
HPV (WINDOPATH): NOT DETECTED

## 2016-09-07 ENCOUNTER — Other Ambulatory Visit: Payer: Self-pay | Admitting: *Deleted

## 2016-09-07 MED ORDER — NORETHIN ACE-ETH ESTRAD-FE 1-20 MG-MCG PO TABS: 1.0000 | ORAL_TABLET | Freq: Every day | ORAL | 4 refills | Status: DC

## 2016-12-15 ENCOUNTER — Encounter: Payer: Self-pay | Admitting: Obstetrics & Gynecology

## 2017-09-10 ENCOUNTER — Other Ambulatory Visit: Payer: Self-pay | Admitting: Obstetrics & Gynecology

## 2017-10-03 ENCOUNTER — Encounter: Payer: Self-pay | Admitting: Obstetrics & Gynecology

## 2017-10-03 ENCOUNTER — Ambulatory Visit (INDEPENDENT_AMBULATORY_CARE_PROVIDER_SITE_OTHER): Payer: Managed Care, Other (non HMO) | Admitting: Obstetrics & Gynecology

## 2017-10-03 ENCOUNTER — Other Ambulatory Visit (HOSPITAL_COMMUNITY)
Admission: RE | Admit: 2017-10-03 | Discharge: 2017-10-03 | Disposition: A | Discharge status: Home / Self Care | Payer: Managed Care, Other (non HMO) | Source: Ambulatory Visit | Attending: Obstetrics & Gynecology | Admitting: Obstetrics & Gynecology

## 2017-10-03 VITALS — BP 143/86 | HR 94 | Ht 63.0 in | Wt 233.0 lb

## 2017-10-03 DIAGNOSIS — Z01419 Encounter for gynecological examination (general) (routine) without abnormal findings: Secondary | ICD-10-CM | POA: Diagnosis present

## 2017-10-03 DIAGNOSIS — Z1272 Encounter for screening for malignant neoplasm of vagina: Secondary | ICD-10-CM | POA: Insufficient documentation

## 2017-10-03 MED ORDER — NORETHIN ACE-ETH ESTRAD-FE 1-20 MG-MCG PO TABS: 1.0000 | ORAL_TABLET | Freq: Every day | ORAL | 4 refills | Status: DC

## 2017-10-03 NOTE — Progress Notes (Signed)
Subjective:     Anne [name] is a 37 y.o. female here for a routine exam.  Patient's last menstrual period was 09/19/2017 (exact date). G0P0000 Birth Control Method:  COCP Menstrual Calendar(currently): regular  Current complaints: none.   Current acute medical issues:  Started anti hypertensive   Recent Gynecologic History Patient's last menstrual period was 09/19/2017 (exact date). Last Pap: 2018,  normal Last mammogram: ,    Past Medical History:  Diagnosis Date  . Anxiety   . Depression   . HPV (human papilloma virus) infection   . Hyperlipidemia   . Medical history non-contributory   . Thyroid disease     Past Surgical History:  Procedure Laterality Date  . NO PAST SURGERIES      OB History    Gravida  0   Para  0   Term  0   Preterm  0   AB  0   Living  0     SAB  0   TAB  0   Ectopic  0   Multiple  0   Live Births  0           Social History   Socioeconomic History  . Marital status: Single    Spouse name: Not on file  . Number of children: Not on file  . Years of education: Not on file  . Highest education level: Not on file  Occupational History  . Not on file  Social Needs  . Financial resource strain: Not on file  . Food insecurity:    Worry: Not on file    Inability: Not on file  . Transportation needs:    Medical: Not on file    Non-medical: Not on file  Tobacco Use  . Smoking status: Never Smoker  . Smokeless tobacco: Never Used  Substance and Sexual Activity  . Alcohol use: No  . Drug use: No  . Sexual activity: Yes    Birth control/protection: Pill  Lifestyle  . Physical activity:    Days per week: Not on file    Minutes per session: Not on file  . Stress: Not on file  Relationships  . Social connections:    Talks on phone: Not on file    Gets together: Not on file    Attends religious service: Not on file    Active member of club or organization: Not on file    Attends meetings of clubs or  organizations: Not on file    Relationship status: Not on file  Other Topics Concern  . Not on file  Social History Narrative  . Not on file    Family History  Problem Relation Age of Onset  . Hypertension Father   . Heart disease Father   . Diabetes Father   . Hypercholesterolemia Father   . Stroke Father   . Other Maternal Grandmother        hole in heart  . Other Maternal Grandfather        MVA  . Thyroid disease Mother   . Other Brother        house fire  . Cancer Sister        skin  . Schizophrenia Sister   . Alzheimer's disease Sister   . Kidney Stones Sister      Current Outpatient Medications:  .  ALPRAZolam (XANAX) 0.5 MG tablet, Take 0.5 mg by mouth as needed. , Disp: , Rfl:  .  amLODipine (NORVASC) 5 MG  tablet, , Disp: , Rfl:  .  atorvastatin (LIPITOR) 20 MG tablet, Take 10 mg by mouth daily. , Disp: , Rfl:  .  BLISOVI FE 1/20 1-20 MG-MCG tablet, TAKE 1 TABLET DAILY, Disp: 84 tablet, Rfl: 4 .  buPROPion (WELLBUTRIN XL) 150 MG 24 hr tablet, Take 300 mg by mouth daily. , Disp: , Rfl:  .  escitalopram (LEXAPRO) 10 MG tablet, Take 10 mg by mouth daily. , Disp: , Rfl:  .  levothyroxine (SYNTHROID, LEVOTHROID) 50 MCG tablet, Take 100 mcg by mouth daily before breakfast. , Disp: , Rfl:  .  podofilox (CONDYLOX) 0.5 % external solution, Apply topically 2 (two) times daily., Disp: 3.5 mL, Rfl: 11  Review of Systems  Review of Systems  Constitutional: Negative for fever, chills, weight loss, malaise/fatigue and diaphoresis.  HENT: Negative for hearing loss, ear pain, nosebleeds, congestion, sore throat, neck pain, tinnitus and ear discharge.   Eyes: Negative for blurred vision, double vision, photophobia, pain, discharge and redness.  Respiratory: Negative for cough, hemoptysis, sputum production, shortness of breath, wheezing and stridor.   Cardiovascular: Negative for chest pain, palpitations, orthopnea, claudication, leg swelling and PND.  Gastrointestinal: negative  for abdominal pain. Negative for heartburn, nausea, vomiting, diarrhea, constipation, blood in stool and melena.  Genitourinary: Negative for dysuria, urgency, frequency, hematuria and flank pain.  Musculoskeletal: Negative for myalgias, back pain, joint pain and falls.  Skin: Negative for itching and rash.  Neurological: Negative for dizziness, tingling, tremors, sensory change, speech change, focal weakness, seizures, loss of consciousness, weakness and headaches.  Endo/Heme/Allergies: Negative for environmental allergies and polydipsia. Does not bruise/bleed easily.  Psychiatric/Behavioral: Negative for depression, suicidal ideas, hallucinations, memory loss and substance abuse. The patient is not nervous/anxious and does not have insomnia.        Objective:  Blood pressure (!) 143/86, pulse 94, height 5\' 3"  (1.6 m), weight 233 lb (105.7 kg), last menstrual period 09/19/2017.   Physical Exam  Vitals reviewed. Constitutional: She is oriented to person, place, and time. She appears well-developed and well-nourished.  HENT:  Head: Normocephalic and atraumatic.        Right Ear: External ear normal.  Left Ear: External ear normal.  Nose: Nose normal.  Mouth/Throat: Oropharynx is clear and moist.  Eyes: Conjunctivae and EOM are normal. Pupils are equal, round, and reactive to light. Right eye exhibits no discharge. Left eye exhibits no discharge. No scleral icterus.  Neck: Normal range of motion. Neck supple. No tracheal deviation present. No thyromegaly present.  Cardiovascular: Normal rate, regular rhythm, normal heart sounds and intact distal pulses.  Exam reveals no gallop and no friction rub.   No murmur heard. Respiratory: Effort normal and breath sounds normal. No respiratory distress. She has no wheezes. She has no rales. She exhibits no tenderness.  GI: Soft. Bowel sounds are normal. She exhibits no distension and no mass. There is no tenderness. There is no rebound and no guarding.   Genitourinary:  Breasts no masses skin changes or nipple changes bilaterally      Vulva is normal without lesions Vagina is pink moist without discharge Cervix normal in appearance and pap is done Uterus is normal size shape and contour Adnexa is negative with normal sized ovaries  Musculoskeletal: Normal range of motion. She exhibits no edema and no tenderness.  Neurological: She is alert and oriented to person, place, and time. She has normal reflexes. She displays normal reflexes. No cranial nerve deficit. She exhibits normal muscle tone. Coordination normal.  Skin: Skin is warm and dry. No rash noted. No erythema. No pallor.  Psychiatric: She has a normal mood and affect. Her behavior is normal. Judgment and thought content normal.       Medications Ordered at today's visit: No orders of the defined types were placed in this encounter.   Other orders placed at today's visit: No orders of the defined types were placed in this encounter.     Assessment:    Healthy female exam.    Plan:    Contraception: OCP (estrogen/progesterone). Follow up in: 1 year.     No follow-ups on file.

## 2017-10-05 LAB — CYTOLOGY - PAP
Adequacy: ABSENT
Diagnosis: NEGATIVE
HPV: NOT DETECTED

## 2018-03-06 DIAGNOSIS — E039 Hypothyroidism, unspecified: Secondary | ICD-10-CM | POA: Diagnosis not present

## 2018-03-06 DIAGNOSIS — R7309 Other abnormal glucose: Secondary | ICD-10-CM | POA: Diagnosis not present

## 2018-03-06 DIAGNOSIS — E7849 Other hyperlipidemia: Secondary | ICD-10-CM | POA: Diagnosis not present

## 2018-03-06 DIAGNOSIS — I10 Essential (primary) hypertension: Secondary | ICD-10-CM | POA: Diagnosis not present

## 2018-03-06 DIAGNOSIS — Z6841 Body Mass Index (BMI) 40.0 and over, adult: Secondary | ICD-10-CM | POA: Diagnosis not present

## 2018-03-06 DIAGNOSIS — Z1389 Encounter for screening for other disorder: Secondary | ICD-10-CM | POA: Diagnosis not present

## 2018-03-06 DIAGNOSIS — Z0001 Encounter for general adult medical examination with abnormal findings: Secondary | ICD-10-CM | POA: Diagnosis not present

## 2018-03-06 DIAGNOSIS — E782 Mixed hyperlipidemia: Secondary | ICD-10-CM | POA: Diagnosis not present

## 2018-03-06 DIAGNOSIS — F419 Anxiety disorder, unspecified: Secondary | ICD-10-CM | POA: Diagnosis not present

## 2018-08-22 ENCOUNTER — Encounter: Payer: Self-pay | Admitting: Adult Health

## 2018-08-22 ENCOUNTER — Ambulatory Visit (INDEPENDENT_AMBULATORY_CARE_PROVIDER_SITE_OTHER): Payer: BC Managed Care – PPO | Admitting: Adult Health

## 2018-08-22 ENCOUNTER — Other Ambulatory Visit: Payer: Self-pay

## 2018-08-22 VITALS — BP 127/82 | HR 89 | Ht 63.0 in | Wt 235.0 lb

## 2018-08-22 DIAGNOSIS — Z01419 Encounter for gynecological examination (general) (routine) without abnormal findings: Secondary | ICD-10-CM | POA: Insufficient documentation

## 2018-08-22 DIAGNOSIS — N852 Hypertrophy of uterus: Secondary | ICD-10-CM | POA: Insufficient documentation

## 2018-08-22 DIAGNOSIS — Z3041 Encounter for surveillance of contraceptive pills: Secondary | ICD-10-CM | POA: Insufficient documentation

## 2018-08-22 DIAGNOSIS — R1903 Right lower quadrant abdominal swelling, mass and lump: Secondary | ICD-10-CM | POA: Insufficient documentation

## 2018-08-22 MED ORDER — NORETHIN ACE-ETH ESTRAD-FE 1-20 MG-MCG PO TABS: 1.0000 | ORAL_TABLET | Freq: Every day | ORAL | 4 refills | Status: DC

## 2018-08-22 NOTE — Progress Notes (Signed)
Patient ID: Anne [name], female   DOB: May 01, 1980, 38 y.o.   MRN: [id number] History of Present Illness: Makaela is a 38 year old white female, single, G0P0, in for a well woman gyn exam,she had a normal pap with HPV 10/03/17.  PCP is [place].   Current Medications, Allergies, Past Medical History, Past Surgical History, Family History and Social History were reviewed in [employer] record.     Review of Systems: Patient denies any headaches, hearing loss, fatigue, blurred vision, shortness of breath, chest pain,  problems with bowel movements, urination, or intercourse. No joint pain or mood swings. RLQ pain on and off, periods good on OCs Has heavy painful periods before OCs.   Physical Exam:BP 127/82 (BP Location: Left Arm, Patient Position: Sitting, Cuff Size: Large)   Pulse 89   Ht 5\' 3"  (1.6 m)   Wt 235 lb (106.6 kg)   LMP 08/16/2018   BMI 41.63 kg/m  General:  Well developed, well nourished, no acute distress Skin:  Warm and dry Neck:  Midline trachea, normal thyroid, good ROM, no lymphadenopathy Lungs; Clear to auscultation bilaterally Breast:  No dominant palpable mass, retraction, or nipple discharge Cardiovascular: Regular rate and rhythm Abdomen:  Soft, non tender, no hepatosplenomegaly Pelvic:  External genitalia is normal in appearance, no lesions.  The vagina is normal in appearance. Urethra has no lesions or masses. The cervix is null[parous  Uterus is felt to be enlarged.  No adnexal masses or tenderness noted.Bladder is non tender, no masses felt. Extremities/musculoskeletal:  No swelling or varicosities noted, no clubbing or cyanosis Psych:  No mood changes, alert and cooperative,seems happy Fall risk is low PHQ 2 score is 0. Examination chaperoned by Levy Pupa LPN.  Impression: 1. Encounter for well woman exam with routine gynecological exam   2. RLQ abdominal mass   3. Enlarged uterus   4. Encounter for surveillance of  contraceptive pills       Plan: Will get [place] to assess uterus  Physical in 1 year Pap in 2022 Labs with PCP Will continue OCs Meds ordered this encounter  Medications  . norethindrone-ethinyl estradiol (BLISOVI FE 1/20) 1-20 MG-MCG tablet    Sig: Take 1 tablet by mouth daily.    Dispense:  84 tablet    Refill:  4    Order Specific Question:   Supervising Provider    Answer:   Tania Ade H [[id number]]

## 2018-10-02 ENCOUNTER — Ambulatory Visit (INDEPENDENT_AMBULATORY_CARE_PROVIDER_SITE_OTHER): Payer: BC Managed Care – PPO

## 2018-10-02 ENCOUNTER — Other Ambulatory Visit: Payer: Self-pay

## 2018-10-02 DIAGNOSIS — I10 Essential (primary) hypertension: Secondary | ICD-10-CM | POA: Diagnosis not present

## 2018-10-02 DIAGNOSIS — E7849 Other hyperlipidemia: Secondary | ICD-10-CM | POA: Diagnosis not present

## 2018-10-02 DIAGNOSIS — N852 Hypertrophy of uterus: Secondary | ICD-10-CM

## 2018-10-02 DIAGNOSIS — Z23 Encounter for immunization: Secondary | ICD-10-CM | POA: Diagnosis not present

## 2018-10-02 DIAGNOSIS — Z1389 Encounter for screening for other disorder: Secondary | ICD-10-CM | POA: Diagnosis not present

## 2018-10-02 DIAGNOSIS — E039 Hypothyroidism, unspecified: Secondary | ICD-10-CM | POA: Diagnosis not present

## 2018-10-02 DIAGNOSIS — Z6841 Body Mass Index (BMI) 40.0 and over, adult: Secondary | ICD-10-CM | POA: Diagnosis not present

## 2018-10-02 DIAGNOSIS — F419 Anxiety disorder, unspecified: Secondary | ICD-10-CM | POA: Diagnosis not present

## 2018-10-02 DIAGNOSIS — R1903 Right lower quadrant abdominal swelling, mass and lump: Secondary | ICD-10-CM

## 2018-10-02 DIAGNOSIS — R7309 Other abnormal glucose: Secondary | ICD-10-CM | POA: Diagnosis not present

## 2018-10-02 NOTE — Progress Notes (Signed)
PELVIC US TA/TV: Homogeneous anteverted uterus,wnl,EEC 4 mm,normal ovaries bilat,no free fluid,ovaries appear mobile,no pain during ultrasound

## 2019-03-12 DIAGNOSIS — E7849 Other hyperlipidemia: Secondary | ICD-10-CM | POA: Diagnosis not present

## 2019-03-12 DIAGNOSIS — E039 Hypothyroidism, unspecified: Secondary | ICD-10-CM | POA: Diagnosis not present

## 2019-03-12 DIAGNOSIS — R000 Tachycardia, unspecified: Secondary | ICD-10-CM | POA: Diagnosis not present

## 2019-03-12 DIAGNOSIS — Z6841 Body Mass Index (BMI) 40.0 and over, adult: Secondary | ICD-10-CM | POA: Diagnosis not present

## 2019-03-12 DIAGNOSIS — Z1389 Encounter for screening for other disorder: Secondary | ICD-10-CM | POA: Diagnosis not present

## 2019-03-12 DIAGNOSIS — Z0001 Encounter for general adult medical examination with abnormal findings: Secondary | ICD-10-CM | POA: Diagnosis not present

## 2019-03-12 DIAGNOSIS — F419 Anxiety disorder, unspecified: Secondary | ICD-10-CM | POA: Diagnosis not present

## 2019-03-12 DIAGNOSIS — R7309 Other abnormal glucose: Secondary | ICD-10-CM | POA: Diagnosis not present

## 2019-03-26 DIAGNOSIS — Z6841 Body Mass Index (BMI) 40.0 and over, adult: Secondary | ICD-10-CM | POA: Diagnosis not present

## 2019-03-26 DIAGNOSIS — G4719 Other hypersomnia: Secondary | ICD-10-CM | POA: Diagnosis not present

## 2019-03-26 DIAGNOSIS — R0683 Snoring: Secondary | ICD-10-CM | POA: Diagnosis not present

## 2019-04-26 ENCOUNTER — Ambulatory Visit: Payer: Self-pay | Attending: Internal Medicine

## 2019-04-26 DIAGNOSIS — Z23 Encounter for immunization: Secondary | ICD-10-CM

## 2019-04-26 NOTE — Progress Notes (Signed)
   Covid-19 Vaccination Clinic  Name:  Anne [name]    MRN: [id number] DOB: November 05, 1980  04/26/2019  Anne [name] was observed post Covid-19 immunization for 15 minutes without incident. She was provided with Vaccine Information Sheet and instruction to access the V-Safe system.   Anne [name] was instructed to call 911 with any severe reactions post vaccine: Marland Kitchen Difficulty breathing  . Swelling of face and throat  . A fast heartbeat  . A bad rash all over body  . Dizziness and weakness   Immunizations Administered    Name Date Dose VIS Date Route   Moderna COVID-19 Vaccine 04/26/2019 10:01 AM 0.5 mL 12/19/2018 Intramuscular   Manufacturer: Moderna   Lot: [id number]   NDC: 71836-725-50

## 2019-05-02 DIAGNOSIS — N3001 Acute cystitis with hematuria: Secondary | ICD-10-CM | POA: Diagnosis not present

## 2019-05-24 ENCOUNTER — Ambulatory Visit: Payer: Self-pay | Attending: Internal Medicine

## 2019-05-24 DIAGNOSIS — Z23 Encounter for immunization: Secondary | ICD-10-CM

## 2019-05-24 NOTE — Progress Notes (Signed)
   Covid-19 Vaccination Clinic  Name:  [name]    MRN: [id number] DOB: [birth year]  05/24/2019  [name] was observed post Covid-19 immunization for 15 minutes without incident. She was provided with Vaccine Information Sheet and instruction to access the V-Safe system.   [name] was instructed to call 911 with any severe reactions post vaccine: Marland Kitchen Difficulty breathing  . Swelling of face and throat  . A fast heartbeat  . A bad rash all over body  . Dizziness and weakness   Immunizations Administered    Name Date Dose VIS Date Route   Moderna COVID-19 Vaccine 05/24/2019  9:57 AM 0.5 mL 12/2018 Intramuscular   Manufacturer: Moderna   Lot: [id number]   NDC: 62263-335-45

## 2019-07-25 DIAGNOSIS — E039 Hypothyroidism, unspecified: Secondary | ICD-10-CM | POA: Diagnosis not present

## 2019-07-25 DIAGNOSIS — R7309 Other abnormal glucose: Secondary | ICD-10-CM | POA: Diagnosis not present

## 2019-07-25 DIAGNOSIS — Z6841 Body Mass Index (BMI) 40.0 and over, adult: Secondary | ICD-10-CM | POA: Diagnosis not present

## 2019-07-25 DIAGNOSIS — F419 Anxiety disorder, unspecified: Secondary | ICD-10-CM | POA: Diagnosis not present

## 2019-08-23 DIAGNOSIS — H35033 Hypertensive retinopathy, bilateral: Secondary | ICD-10-CM | POA: Diagnosis not present

## 2019-09-04 DIAGNOSIS — E039 Hypothyroidism, unspecified: Secondary | ICD-10-CM | POA: Diagnosis not present

## 2019-10-01 ENCOUNTER — Other Ambulatory Visit (HOSPITAL_COMMUNITY)
Admission: RE | Admit: 2019-10-01 | Discharge: 2019-10-01 | Disposition: A | Discharge status: Home / Self Care | Payer: BC Managed Care – PPO | Source: Ambulatory Visit | Attending: Obstetrics & Gynecology | Admitting: Obstetrics & Gynecology

## 2019-10-01 ENCOUNTER — Other Ambulatory Visit: Payer: Self-pay

## 2019-10-01 ENCOUNTER — Encounter: Payer: Self-pay | Admitting: Obstetrics & Gynecology

## 2019-10-01 ENCOUNTER — Ambulatory Visit (INDEPENDENT_AMBULATORY_CARE_PROVIDER_SITE_OTHER): Payer: BC Managed Care – PPO | Admitting: Obstetrics & Gynecology

## 2019-10-01 VITALS — BP 121/79 | HR 83 | Ht 63.0 in | Wt 236.0 lb

## 2019-10-01 DIAGNOSIS — Z01419 Encounter for gynecological examination (general) (routine) without abnormal findings: Secondary | ICD-10-CM

## 2019-10-01 DIAGNOSIS — B373 Candidiasis of vulva and vagina: Secondary | ICD-10-CM | POA: Diagnosis not present

## 2019-10-01 DIAGNOSIS — B3731 Acute candidiasis of vulva and vagina: Secondary | ICD-10-CM

## 2019-10-01 MED ORDER — TERCONAZOLE 0.4 % VA CREA: 1.0000 | TOPICAL_CREAM | Freq: Every day | VAGINAL | 0 refills | Status: DC

## 2019-10-01 MED ORDER — NORETHIN ACE-ETH ESTRAD-FE 1-20 MG-MCG PO TABS: 1.0000 | ORAL_TABLET | Freq: Every day | ORAL | 4 refills | Status: DC

## 2019-10-01 NOTE — Progress Notes (Signed)
Subjective:     Anne [name] is a 39 y.o. female here for a routine exam.  No LMP recorded (exact date). (Menstrual status: Oral contraceptives). G0P0000 Birth Control Method:  OCP Menstrual Calendar(currently): regular  Current complaints: none.   Current acute medical issues:  none   Recent Gynecologic History No LMP recorded (exact date). (Menstrual status: Oral contraceptives). Last Pap: 2020,  normal Last mammogram: ,  n/a  Past Medical History:  Diagnosis Date  . Anxiety   . Depression   . HPV (human papilloma virus) infection   . Hyperlipidemia   . Medical history non-contributory   . Thyroid disease     Past Surgical History:  Procedure Laterality Date  . NO PAST SURGERIES      OB History    Gravida  0   Para  0   Term  0   Preterm  0   AB  0   Living  0     SAB  0   TAB  0   Ectopic  0   Multiple  0   Live Births  0           Social History   Socioeconomic History  . Marital status: Single    Spouse name: Not on file  . Number of children: Not on file  . Years of education: Not on file  . Highest education level: Not on file  Occupational History  . Not on file  Tobacco Use  . Smoking status: Never Smoker  . Smokeless tobacco: Never Used  Vaping Use  . Vaping Use: Never used  Substance and Sexual Activity  . Alcohol use: No  . Drug use: No  . Sexual activity: Yes    Birth control/protection: Pill  Other Topics Concern  . Not on file  Social History Narrative  . Not on file   Social Determinants of Health   Financial Resource Strain: Low Risk   . Difficulty of Paying Living Expenses: Not hard at all  Food Insecurity: No Food Insecurity  . Worried About Programme researcher, broadcasting/film/video in the Last Year: Never true  . Ran Out of Food in the Last Year: Never true  Transportation Needs: No Transportation Needs  . Lack of Transportation (Medical): No  . Lack of Transportation (Non-Medical): No  Physical Activity: Inactive  . Days  of Exercise per Week: 0 days  . Minutes of Exercise per Session: 0 min  Stress: No Stress Concern Present  . Feeling of Stress : Not at all  Social Connections: Socially Isolated  . Frequency of Communication with Friends and Family: More than three times a week  . Frequency of Social Gatherings with Friends and Family: Once a week  . Attends Religious Services: Never  . Active Member of Clubs or Organizations: No  . Attends Banker Meetings: Never  . Marital Status: Never married    Family History  Problem Relation Age of Onset  . Hypertension Father   . Heart disease Father   . Diabetes Father   . Hypercholesterolemia Father   . Stroke Father   . Other Maternal Grandmother        hole in heart  . Other Maternal Grandfather        MVA  . Thyroid disease Mother   . Other Brother        house fire  . Cancer Sister        skin  . Schizophrenia Sister   .  Alzheimer's disease Sister   . Kidney Stones Sister      Current Outpatient Medications:  .  ALPRAZolam (XANAX) 0.5 MG tablet, Take 0.5 mg by mouth as needed. , Disp: , Rfl:  .  amLODipine (NORVASC) 10 MG tablet, Take 5 mg by mouth daily. , Disp: , Rfl:  .  atorvastatin (LIPITOR) 20 MG tablet, Take 20 mg by mouth daily. , Disp: , Rfl:  .  buPROPion (WELLBUTRIN XL) 150 MG 24 hr tablet, Take 300 mg by mouth daily. , Disp: , Rfl:  .  escitalopram (LEXAPRO) 10 MG tablet, Take 20 mg by mouth daily. , Disp: , Rfl:  .  levothyroxine (SYNTHROID, LEVOTHROID) 50 MCG tablet, Take 150 mcg by mouth daily before breakfast. , Disp: , Rfl:  .  norethindrone-ethinyl estradiol (BLISOVI FE 1/20) 1-20 MG-MCG tablet, Take 1 tablet by mouth daily., Disp: 84 tablet, Rfl: 4 .  terconazole (TERAZOL 7) 0.4 % vaginal cream, Place 1 applicator vaginally at bedtime., Disp: 45 g, Rfl: 0  Review of Systems  Review of Systems  Constitutional: Negative for fever, chills, weight loss, malaise/fatigue and diaphoresis.  HENT: Negative for  hearing loss, ear pain, nosebleeds, congestion, sore throat, neck pain, tinnitus and ear discharge.   Eyes: Negative for blurred vision, double vision, photophobia, pain, discharge and redness.  Respiratory: Negative for cough, hemoptysis, sputum production, shortness of breath, wheezing and stridor.   Cardiovascular: Negative for chest pain, palpitations, orthopnea, claudication, leg swelling and PND.  Gastrointestinal: negative for abdominal pain. Negative for heartburn, nausea, vomiting, diarrhea, constipation, blood in stool and melena.  Genitourinary: Negative for dysuria, urgency, frequency, hematuria and flank pain.  Musculoskeletal: Negative for myalgias, back pain, joint pain and falls.  Skin: Negative for itching and rash.  Neurological: Negative for dizziness, tingling, tremors, sensory change, speech change, focal weakness, seizures, loss of consciousness, weakness and headaches.  Endo/Heme/Allergies: Negative for environmental allergies and polydipsia. Does not bruise/bleed easily.  Psychiatric/Behavioral: Negative for depression, suicidal ideas, hallucinations, memory loss and substance abuse. The patient is not nervous/anxious and does not have insomnia.        Objective:  Blood pressure 121/79, pulse 83, height 5\' 3"  (1.6 m), weight 236 lb (107 kg).   Physical Exam  Vitals reviewed. Constitutional: She is oriented to person, place, and time. She appears well-developed and well-nourished.  HENT:  Head: Normocephalic and atraumatic.        Right Ear: External ear normal.  Left Ear: External ear normal.  Nose: Nose normal.  Mouth/Throat: Oropharynx is clear and moist.  Eyes: Conjunctivae and EOM are normal. Pupils are equal, round, and reactive to light. Right eye exhibits no discharge. Left eye exhibits no discharge. No scleral icterus.  Neck: Normal range of motion. Neck supple. No tracheal deviation present. No thyromegaly present.  Cardiovascular: Normal rate, regular  rhythm, normal heart sounds and intact distal pulses.  Exam reveals no gallop and no friction rub.   No murmur heard. Respiratory: Effort normal and breath sounds normal. No respiratory distress. She has no wheezes. She has no rales. She exhibits no tenderness.  GI: Soft. Bowel sounds are normal. She exhibits no distension and no mass. There is no tenderness. There is no rebound and no guarding.  Genitourinary:  Breasts no masses skin changes or nipple changes bilaterally      Vulva is normal without lesions Vagina is pink moist without discharge Cervix normal in appearance and pap is done Uterus is normal size shape and contour Adnexa is  negative with normal sized ovaries   Musculoskeletal: Normal range of motion. She exhibits no edema and no tenderness.  Neurological: She is alert and oriented to person, place, and time. She has normal reflexes. She displays normal reflexes. No cranial nerve deficit. She exhibits normal muscle tone. Coordination normal.  Skin: Skin is warm and dry. No rash noted. No erythema. No pallor.  Psychiatric: She has a normal mood and affect. Her behavior is normal. Judgment and thought content normal.       Medications Ordered at today's visit: Meds ordered this encounter  Medications  . terconazole (TERAZOL 7) 0.4 % vaginal cream    Sig: Place 1 applicator vaginally at bedtime.    Dispense:  45 g    Refill:  0    Other orders placed at today's visit: No orders of the defined types were placed in this encounter.     Assessment:    Normal Gyn exam.   Pre diabetic: long conversation about diabetes: The End of diabetes recommended Plan:    Contraception: OCP (estrogen/progesterone). Follow up in: 1 year. terazol for yeast infection     Return in about 1 year (around 09/30/2020) for yearly, with Dr Despina Hidden.

## 2019-10-04 LAB — CYTOLOGY - PAP
Comment: NEGATIVE
Diagnosis: NEGATIVE
High risk HPV: NEGATIVE

## 2019-10-07 ENCOUNTER — Other Ambulatory Visit: Payer: Self-pay | Admitting: Adult Health

## 2019-11-06 DIAGNOSIS — E039 Hypothyroidism, unspecified: Secondary | ICD-10-CM | POA: Diagnosis not present

## 2020-03-06 DIAGNOSIS — R519 Headache, unspecified: Secondary | ICD-10-CM | POA: Diagnosis not present

## 2020-03-06 DIAGNOSIS — Z20822 Contact with and (suspected) exposure to covid-19: Secondary | ICD-10-CM | POA: Diagnosis not present

## 2020-03-06 DIAGNOSIS — J029 Acute pharyngitis, unspecified: Secondary | ICD-10-CM | POA: Diagnosis not present

## 2020-03-10 DIAGNOSIS — Z1331 Encounter for screening for depression: Secondary | ICD-10-CM | POA: Diagnosis not present

## 2020-03-10 DIAGNOSIS — E039 Hypothyroidism, unspecified: Secondary | ICD-10-CM | POA: Diagnosis not present

## 2020-03-10 DIAGNOSIS — Z6841 Body Mass Index (BMI) 40.0 and over, adult: Secondary | ICD-10-CM | POA: Diagnosis not present

## 2020-03-10 DIAGNOSIS — F419 Anxiety disorder, unspecified: Secondary | ICD-10-CM | POA: Diagnosis not present

## 2020-03-10 DIAGNOSIS — E7849 Other hyperlipidemia: Secondary | ICD-10-CM | POA: Diagnosis not present

## 2020-03-10 DIAGNOSIS — R7309 Other abnormal glucose: Secondary | ICD-10-CM | POA: Diagnosis not present

## 2020-03-10 DIAGNOSIS — Z1389 Encounter for screening for other disorder: Secondary | ICD-10-CM | POA: Diagnosis not present

## 2020-03-10 DIAGNOSIS — I10 Essential (primary) hypertension: Secondary | ICD-10-CM | POA: Diagnosis not present

## 2020-08-29 DIAGNOSIS — Z20822 Contact with and (suspected) exposure to covid-19: Secondary | ICD-10-CM | POA: Diagnosis not present

## 2020-08-29 DIAGNOSIS — R112 Nausea with vomiting, unspecified: Secondary | ICD-10-CM | POA: Diagnosis not present

## 2020-08-29 DIAGNOSIS — R42 Dizziness and giddiness: Secondary | ICD-10-CM | POA: Diagnosis not present

## 2020-10-06 ENCOUNTER — Other Ambulatory Visit: Payer: Self-pay

## 2020-10-06 ENCOUNTER — Ambulatory Visit (INDEPENDENT_AMBULATORY_CARE_PROVIDER_SITE_OTHER): Payer: BC Managed Care – PPO | Admitting: Adult Health

## 2020-10-06 ENCOUNTER — Encounter: Payer: Self-pay | Admitting: Adult Health

## 2020-10-06 VITALS — BP 121/58 | HR 89 | Ht 63.0 in | Wt 219.0 lb

## 2020-10-06 DIAGNOSIS — Z3041 Encounter for surveillance of contraceptive pills: Secondary | ICD-10-CM

## 2020-10-06 DIAGNOSIS — R300 Dysuria: Secondary | ICD-10-CM | POA: Diagnosis not present

## 2020-10-06 DIAGNOSIS — E782 Mixed hyperlipidemia: Secondary | ICD-10-CM | POA: Diagnosis not present

## 2020-10-06 DIAGNOSIS — Z1211 Encounter for screening for malignant neoplasm of colon: Secondary | ICD-10-CM | POA: Diagnosis not present

## 2020-10-06 DIAGNOSIS — Z01419 Encounter for gynecological examination (general) (routine) without abnormal findings: Secondary | ICD-10-CM

## 2020-10-06 DIAGNOSIS — Z1231 Encounter for screening mammogram for malignant neoplasm of breast: Secondary | ICD-10-CM | POA: Insufficient documentation

## 2020-10-06 DIAGNOSIS — Z23 Encounter for immunization: Secondary | ICD-10-CM | POA: Diagnosis not present

## 2020-10-06 DIAGNOSIS — I10 Essential (primary) hypertension: Secondary | ICD-10-CM | POA: Diagnosis not present

## 2020-10-06 DIAGNOSIS — Z6838 Body mass index (BMI) 38.0-38.9, adult: Secondary | ICD-10-CM | POA: Diagnosis not present

## 2020-10-06 DIAGNOSIS — F419 Anxiety disorder, unspecified: Secondary | ICD-10-CM | POA: Diagnosis not present

## 2020-10-06 DIAGNOSIS — R7309 Other abnormal glucose: Secondary | ICD-10-CM | POA: Diagnosis not present

## 2020-10-06 DIAGNOSIS — B3731 Acute candidiasis of vulva and vagina: Secondary | ICD-10-CM | POA: Insufficient documentation

## 2020-10-06 DIAGNOSIS — E039 Hypothyroidism, unspecified: Secondary | ICD-10-CM | POA: Diagnosis not present

## 2020-10-06 DIAGNOSIS — N898 Other specified noninflammatory disorders of vagina: Secondary | ICD-10-CM | POA: Insufficient documentation

## 2020-10-06 DIAGNOSIS — E7849 Other hyperlipidemia: Secondary | ICD-10-CM | POA: Diagnosis not present

## 2020-10-06 DIAGNOSIS — M545 Low back pain, unspecified: Secondary | ICD-10-CM | POA: Diagnosis not present

## 2020-10-06 DIAGNOSIS — B373 Candidiasis of vulva and vagina: Secondary | ICD-10-CM

## 2020-10-06 LAB — POCT WET PREP (WET MOUNT)

## 2020-10-06 LAB — POCT URINALYSIS DIPSTICK
Blood, UA: NEGATIVE
Glucose, UA: NEGATIVE
Ketones, UA: NEGATIVE
Leukocytes, UA: NEGATIVE
Nitrite, UA: NEGATIVE
Protein, UA: NEGATIVE

## 2020-10-06 LAB — HEMOCCULT GUIAC POC 1CARD (OFFICE): Fecal Occult Blood, POC: NEGATIVE

## 2020-10-06 MED ORDER — NORETHIN ACE-ETH ESTRAD-FE 1-20 MG-MCG PO TABS: ORAL_TABLET | ORAL | 4 refills | Status: DC

## 2020-10-06 MED ORDER — FLUCONAZOLE 150 MG PO TABS: ORAL_TABLET | ORAL | 1 refills | Status: DC

## 2020-10-06 NOTE — Progress Notes (Signed)
Patient ID: [name], female   DOB: 1980/07/26, 40 y.o.   MRN: [id number] History of Present Illness: Anne [name] is a 40 year old white female,single, G0P0, in for a well woman gyn exam PCP is Dr Anne [name].  Lab Results  Component Value Date   DIAGPAP  10/01/2019    - Negative for intraepithelial lesion or malignancy (NILM)   HPV NOT DETECTED 10/03/2017   HPVHIGH Negative 10/01/2019    Current Medications, Allergies, Past Medical History, Past Surgical History, Family History and Social History were reviewed in [employer] record.     Review of Systems:  Patient denies any headaches, hearing loss, fatigue, blurred vision, shortness of breath, chest pain, abdominal pain, problems with bowel movements, or intercourse. No joint pain or mood swings.  Has had pain in right side, had picked up coins at work [employer] when [employer] She is happy with her OCs  Physical Exam:BP (!) 121/58 (BP Location: Left Arm, Patient Position: Sitting, Cuff Size: Normal)   Pulse 89   Ht 5\' 3"  (1.6 m)   Wt 219 lb (99.3 kg)   LMP 08/26/2020   BMI 38.79 kg/m  urine dipstick is negative. General:  Well developed, well nourished, no acute distress Skin:  Warm and dry Neck:  Midline trachea, normal thyroid, good ROM, no lymphadenopathy Lungs; Clear to auscultation bilaterally Breast:  No dominant palpable mass, retraction, or nipple discharge Cardiovascular: Regular rate and rhythm Abdomen:  Soft, non tender, no hepatosplenomegaly Pelvic:  External genitalia is normal in appearance, no lesions.  The vagina has clumpy discharge, wet prep:+yeast. Urethra has no lesions or masses. The cervix is nulliparous.  Uterus is felt to be normal size, shape, and contour.  No adnexal masses or tenderness noted.Bladder is non tender, no masses felt. Rectal: Good sphincter tone, no polyps, or hemorrhoids felt.  Hemoccult negative. Extremities/musculoskeletal:  No swelling or varicosities noted, no clubbing or  cyanosis Psych:  No mood changes, alert and cooperative,seems happy AA is 0  Fall risk is low Depression screen [facility] 2/9 10/06/2020 10/01/2019 08/22/2018  Decreased Interest 2 0 0  Down, Depressed, Hopeless 2 1 0  PHQ - 2 Score 4 1 0  Altered sleeping 2 1 -  Tired, decreased energy 2 1 -  Change in appetite 2 1 -  Feeling bad or failure about yourself  2 0 -  Trouble concentrating 0 0 -  Moving slowly or fidgety/restless 0 0 -  Suicidal thoughts 0 0 -  PHQ-9 Score 12 4 -   She is on meds. GAD 7 : Generalized Anxiety Score 10/06/2020 10/01/2019  Nervous, Anxious, on Edge 2 1  Control/stop worrying 2 1  Worry too much - different things 2 2  Trouble relaxing 2 2  Restless 2 0  Easily annoyed or irritable 2 2  Afraid - awful might happen 2 1  Total GAD 7 Score 14 9      Upstream - 10/06/20 0838       Pregnancy Intention Screening   Does the patient want to become pregnant in the next year? No    Does the patient's partner want to become pregnant in the next year? No    Would the patient like to discuss contraceptive options today? Yes      Contraception Wrap Up   Current Method Oral Contraceptive    End Method Oral Contraceptive    Contraception Counseling Provided Yes  Examination chaperoned by [name] RN.  Impression and plan: 1. Burning with urination  - POCT Urinalysis Dipstick  2. Acute right-sided low back pain without sciatica   3. Encounter for well woman exam with routine gynecological exam Physical in 1 year Pap 2024 To get labs with PCP Cologuard at 45   4. Encounter for screening fecal occult blood testing  - POCT occult blood stool  5. Screening mammogram for breast cancer Mammogram scheduled for her 10/16/20 at [name] at 7:30 am - MM 3D SCREEN BREAST BILATERAL; Future  6. Vaginal discharge  - POCT Wet Prep [facility])  7. Yeast vaginitis Will rx diflucan - POCT Wet Prep [name])  8. Encounter for surveillance of  contraceptive pills Will refill OCs  Meds ordered this encounter  Medications   norethindrone-ethinyl estradiol-FE (AUROVELA FE 1/20) 1-20 MG-MCG tablet    Sig: TAKE 1 TABLET BY MOUTH DAILY. GENERIC SUBSTITUTION FOR BLISOVI FE    Dispense:  84 tablet    Refill:  4    Order Specific Question:   Supervising Provider    Answer:   Despina Hidden, LUTHER H [[id number]]   fluconazole (DIFLUCAN) 150 MG tablet    Sig: Take 1 now and 1 in 3 days    Dispense:  2 tablet    Refill:  1    Order Specific Question:   Supervising Provider    Answer:   Duane Lope H [[id number]]

## 2020-10-16 ENCOUNTER — Ambulatory Visit (HOSPITAL_COMMUNITY): Payer: BC Managed Care – PPO

## 2020-10-31 ENCOUNTER — Ambulatory Visit (HOSPITAL_COMMUNITY)
Admission: RE | Admit: 2020-10-31 | Discharge: 2020-10-31 | Disposition: A | Discharge status: Home / Self Care | Payer: BC Managed Care – PPO | Source: Ambulatory Visit | Attending: Adult Health | Admitting: Adult Health

## 2020-10-31 ENCOUNTER — Other Ambulatory Visit: Payer: Self-pay

## 2020-10-31 DIAGNOSIS — Z1231 Encounter for screening mammogram for malignant neoplasm of breast: Secondary | ICD-10-CM | POA: Insufficient documentation

## 2021-04-01 DIAGNOSIS — F419 Anxiety disorder, unspecified: Secondary | ICD-10-CM | POA: Diagnosis not present

## 2021-04-01 DIAGNOSIS — E6609 Other obesity due to excess calories: Secondary | ICD-10-CM | POA: Diagnosis not present

## 2021-04-01 DIAGNOSIS — Z6839 Body mass index (BMI) 39.0-39.9, adult: Secondary | ICD-10-CM | POA: Diagnosis not present

## 2021-10-07 ENCOUNTER — Ambulatory Visit (INDEPENDENT_AMBULATORY_CARE_PROVIDER_SITE_OTHER): Payer: BC Managed Care – PPO | Admitting: Advanced Practice Midwife

## 2021-10-07 ENCOUNTER — Other Ambulatory Visit (HOSPITAL_COMMUNITY): Payer: Self-pay | Admitting: Internal Medicine

## 2021-10-07 ENCOUNTER — Encounter: Payer: Self-pay | Admitting: Advanced Practice Midwife

## 2021-10-07 VITALS — BP 139/87 | HR 76 | Ht 63.0 in | Wt 216.6 lb

## 2021-10-07 DIAGNOSIS — Z6838 Body mass index (BMI) 38.0-38.9, adult: Secondary | ICD-10-CM | POA: Diagnosis not present

## 2021-10-07 DIAGNOSIS — Z1389 Encounter for screening for other disorder: Secondary | ICD-10-CM | POA: Diagnosis not present

## 2021-10-07 DIAGNOSIS — E6609 Other obesity due to excess calories: Secondary | ICD-10-CM | POA: Diagnosis not present

## 2021-10-07 DIAGNOSIS — I10 Essential (primary) hypertension: Secondary | ICD-10-CM | POA: Diagnosis not present

## 2021-10-07 DIAGNOSIS — Z01419 Encounter for gynecological examination (general) (routine) without abnormal findings: Secondary | ICD-10-CM | POA: Diagnosis not present

## 2021-10-07 DIAGNOSIS — Z0001 Encounter for general adult medical examination with abnormal findings: Secondary | ICD-10-CM | POA: Diagnosis not present

## 2021-10-07 DIAGNOSIS — Z23 Encounter for immunization: Secondary | ICD-10-CM | POA: Diagnosis not present

## 2021-10-07 DIAGNOSIS — E063 Autoimmune thyroiditis: Secondary | ICD-10-CM | POA: Diagnosis not present

## 2021-10-07 DIAGNOSIS — Z1331 Encounter for screening for depression: Secondary | ICD-10-CM | POA: Diagnosis not present

## 2021-10-07 DIAGNOSIS — Z1231 Encounter for screening mammogram for malignant neoplasm of breast: Secondary | ICD-10-CM

## 2021-10-07 DIAGNOSIS — E782 Mixed hyperlipidemia: Secondary | ICD-10-CM | POA: Diagnosis not present

## 2021-10-07 MED ORDER — NORETHIN ACE-ETH ESTRAD-FE 1-20 MG-MCG PO TABS: ORAL_TABLET | ORAL | 4 refills | Status: DC

## 2021-10-07 NOTE — Progress Notes (Signed)
WELL-WOMAN EXAMINATION Patient name: Anne [name] MRN [id number]  Date of birth: 10-16-1980 Chief Complaint:   Gynecologic Exam  History of Present Illness:   Anne [name] is a 41 y.o. G0P0000 Caucasian female being seen today for a routine well-woman exam.  Current complaints: doing well on Loestrin- wants to continue; reg cycles; saw Dr Gerarda Fraction this morning and got mammo scheduled  PCP: Dr Hilma Favors      does not desire labs No LMP recorded. (Menstrual status: Oral contraceptives). The current method of family planning is OCP (estrogen/progesterone).  Last pap Sept 2021. Results were: NILM w/ HRHPV negative. H/O abnormal pap: no Last mammogram: Oct 2022. Results were: normal. Family h/o breast cancer: no Last colonoscopy: never. Results were: N/A. Family h/o colorectal cancer: no     10/07/2021   10:58 AM 10/06/2020    8:38 AM 10/01/2019    8:45 AM 08/22/2018    9:03 AM 08/24/2016    3:59 PM  Depression screen PHQ 2/9  Decreased Interest 1 2 0 0 3  Down, Depressed, Hopeless 1 2 1  0 0  PHQ - 2 Score 2 4 1  0 3  Altered sleeping 1 2 1  3   Tired, decreased energy 1 2 1  3   Change in appetite 0 2 1  0  Feeling bad or failure about yourself  0 2 0  0  Trouble concentrating 1 0 0  0  Moving slowly or fidgety/restless 0 0 0  0  Suicidal thoughts 0 0 0  0  PHQ-9 Score 5 12 4  9         10/07/2021   10:58 AM 10/06/2020    8:38 AM 10/01/2019    8:45 AM  GAD 7 : Generalized Anxiety Score  Nervous, Anxious, on Edge 1 2 1   Control/stop worrying 0 2 1  Worry too much - different things 1 2 2   Trouble relaxing 0 2 2  Restless 0 2 0  Easily annoyed or irritable 0 2 2  Afraid - awful might happen 0 2 1  Total GAD 7 Score 2 14 9      Review of Systems:   Pertinent items are noted in HPI Denies any headaches, blurred vision, fatigue, shortness of breath, chest pain, abdominal pain, abnormal vaginal discharge/itching/odor/irritation, problems with periods, bowel movements, urination, or  intercourse unless otherwise stated above. Pertinent History Reviewed:  Reviewed past medical,surgical, social and family history.  Reviewed problem list, medications and allergies. Physical Assessment:   Vitals:   10/07/21 1104 10/07/21 1108  BP: (!) 141/95 139/87  Pulse: 82 76  Weight: 216 lb 9.6 oz (98.2 kg)   Height: 5\' 3"  (1.6 m)   Body mass index is 38.37 kg/m.        Physical Examination:   General appearance - well appearing, and in no distress  Mental status - alert, oriented to person, place, and time  Psych:  She has a normal mood and affect  Skin - warm and dry, normal color, no suspicious lesions noted  Chest - effort normal, all lung fields clear to auscultation bilaterally  Heart - normal rate and regular rhythm  Neck:  midline trachea, no thyromegaly or nodules  Breasts - breasts appear normal, no suspicious masses, no skin or nipple changes or  axillary nodes  Abdomen - soft, nontender, nondistended, no masses or organomegaly  Pelvic - VULVA: normal appearing vulva with no masses, tenderness or lesions  VAGINA: normal appearing vagina with normal color and  discharge, no lesions  CERVIX: normal appearing cervix without discharge or lesions, no CMT  Thin prep pap is not done   UTERUS: uterus is felt to be normal size, shape, consistency and nontender   ADNEXA: No adnexal masses or tenderness noted.  Rectal - not examined  Extremities:  No swelling or varicosities noted   No results found for this or any previous visit (from the past 24 hour(s)).  Assessment & Plan:  1) Well-Woman Exam  2) Desires to continue on Loestrin, refills x 81yr  3) Initial ^BP, states was much lower at PCP this am; will continue to follow there prn  Labs/procedures today: none  Mammogram:  screening mammo already ordered for 11/04/21 Colonoscopy: @ 41yo, or sooner if problems  No orders of the defined types were placed in this encounter.   Meds:  Meds ordered this encounter   Medications   norethindrone-ethinyl estradiol-FE (AUROVELA FE 1/20) 1-20 MG-MCG tablet    Sig: TAKE 1 TABLET BY MOUTH DAILY. GENERIC SUBSTITUTION FOR BLISOVI FE    Dispense:  84 tablet    Refill:  4    Order Specific Question:   Supervising Provider    Answer:   Janyth Pupa [id number]    Follow-up: Return in about 1 year (around 10/08/2022) for Pap & Physical.  Myrtis Ser CNM 10/07/2021 11:40 AM

## 2021-10-09 DIAGNOSIS — D518 Other vitamin B12 deficiency anemias: Secondary | ICD-10-CM | POA: Diagnosis not present

## 2021-11-04 ENCOUNTER — Ambulatory Visit (HOSPITAL_COMMUNITY): Payer: BC Managed Care – PPO

## 2021-11-11 ENCOUNTER — Ambulatory Visit (HOSPITAL_COMMUNITY)
Admission: RE | Admit: 2021-11-11 | Discharge: 2021-11-11 | Disposition: A | Discharge status: Home / Self Care | Payer: BC Managed Care – PPO | Source: Ambulatory Visit | Attending: Internal Medicine | Admitting: Internal Medicine

## 2021-11-11 DIAGNOSIS — Z1231 Encounter for screening mammogram for malignant neoplasm of breast: Secondary | ICD-10-CM | POA: Diagnosis not present

## 2021-11-11 DIAGNOSIS — D518 Other vitamin B12 deficiency anemias: Secondary | ICD-10-CM | POA: Diagnosis not present

## 2021-11-30 ENCOUNTER — Other Ambulatory Visit: Payer: Self-pay | Admitting: Adult Health

## 2021-12-17 DIAGNOSIS — D518 Other vitamin B12 deficiency anemias: Secondary | ICD-10-CM | POA: Diagnosis not present

## 2022-02-24 ENCOUNTER — Other Ambulatory Visit (INDEPENDENT_AMBULATORY_CARE_PROVIDER_SITE_OTHER): Payer: No Typology Code available for payment source | Admitting: *Deleted

## 2022-02-24 ENCOUNTER — Other Ambulatory Visit (HOSPITAL_COMMUNITY)
Admission: RE | Admit: 2022-02-24 | Discharge: 2022-02-24 | Disposition: A | Discharge status: Home / Self Care | Payer: No Typology Code available for payment source | Source: Ambulatory Visit | Attending: Obstetrics & Gynecology | Admitting: Obstetrics & Gynecology

## 2022-02-24 DIAGNOSIS — Z113 Encounter for screening for infections with a predominantly sexual mode of transmission: Secondary | ICD-10-CM

## 2022-02-24 DIAGNOSIS — R300 Dysuria: Secondary | ICD-10-CM | POA: Diagnosis not present

## 2022-02-24 LAB — POCT URINALYSIS DIPSTICK OB
Glucose, UA: NEGATIVE
Ketones, UA: NEGATIVE
Leukocytes, UA: NEGATIVE
Nitrite, UA: NEGATIVE

## 2022-02-24 NOTE — Progress Notes (Signed)
   NURSE VISIT- UTI SYMPTOMS   SUBJECTIVE:  Anne [name] is a 42 y.o. G0P0000 female here for UTI symptoms. She is a GYN patient. She reports dysuria.  OBJECTIVE:  There were no vitals taken for this visit.  Appears well, in no apparent distress  No results found for this or any previous visit (from the past 24 hour(s)).  ASSESSMENT: GYN patient with UTI symptoms and negative nitrites  PLAN: Note routed to Derrek Monaco, AGNP   Rx sent by provider today: No Urine culture sent Call or return to clinic prn if these symptoms worsen or fail to improve as anticipated. Follow-up: as needed   Pt also requested swab for std and blood work.   Anne [name]  02/24/2022 2:59 PM

## 2022-02-26 ENCOUNTER — Telehealth: Payer: Self-pay | Admitting: Adult Health

## 2022-02-26 LAB — RPR, QUANT+TP ABS (REFLEX)
Rapid Plasma Reagin, Quant: 1:1 {titer} — ABNORMAL HIGH
T Pallidum Abs: NONREACTIVE

## 2022-02-26 LAB — HEPATITIS B SURFACE ANTIGEN: Hepatitis B Surface Ag: NEGATIVE

## 2022-02-26 LAB — CERVICOVAGINAL ANCILLARY ONLY
Bacterial Vaginitis (gardnerella): POSITIVE — AB
Candida Glabrata: NEGATIVE
Candida Vaginitis: NEGATIVE
Chlamydia: NEGATIVE
Comment: NEGATIVE
Comment: NEGATIVE
Comment: NEGATIVE
Comment: NEGATIVE
Comment: NEGATIVE
Comment: NORMAL
Neisseria Gonorrhea: NEGATIVE
Trichomonas: NEGATIVE

## 2022-02-26 LAB — URINALYSIS
Bilirubin, UA: NEGATIVE
Glucose, UA: NEGATIVE
Ketones, UA: NEGATIVE
Nitrite, UA: NEGATIVE
Specific Gravity, UA: 1.022 (ref 1.005–1.030)
Urobilinogen, Ur: 1 mg/dL (ref 0.2–1.0)
pH, UA: 6 (ref 5.0–7.5)

## 2022-02-26 LAB — RPR: RPR Ser Ql: REACTIVE — AB

## 2022-02-26 LAB — HIV ANTIBODY (ROUTINE TESTING W REFLEX): HIV Screen 4th Generation wRfx: NONREACTIVE

## 2022-02-26 LAB — HEPATITIS C ANTIBODY: Hep C Virus Ab: NONREACTIVE

## 2022-02-26 NOTE — Telephone Encounter (Signed)
Left message to call me on Monday about labs

## 2022-02-26 NOTE — Telephone Encounter (Signed)
Pt is requesting a call back about test results.

## 2022-02-28 LAB — URINE CULTURE

## 2022-03-01 ENCOUNTER — Telehealth: Payer: Self-pay | Admitting: Adult Health

## 2022-03-01 MED ORDER — METRONIDAZOLE 500 MG PO TABS: 500.0000 mg | ORAL_TABLET | Freq: Two times a day (BID) | ORAL | 0 refills | Status: DC

## 2022-03-01 MED ORDER — SULFAMETHOXAZOLE-TRIMETHOPRIM 800-160 MG PO TABS: 1.0000 | ORAL_TABLET | Freq: Two times a day (BID) | ORAL | 0 refills | Status: DC

## 2022-03-01 NOTE — Telephone Encounter (Signed)
Pt aware of labs and that + E coli on urine culture and +BV, will rx septra ds and flagyl

## 2022-04-19 ENCOUNTER — Telehealth: Payer: Self-pay | Admitting: Obstetrics & Gynecology

## 2022-04-19 ENCOUNTER — Other Ambulatory Visit: Payer: Self-pay | Admitting: Adult Health

## 2022-04-19 MED ORDER — NORETHIN ACE-ETH ESTRAD-FE 1-20 MG-MCG PO TABS: ORAL_TABLET | ORAL | 4 refills | Status: DC

## 2022-04-19 NOTE — Telephone Encounter (Signed)
Pt needs her birth control sent to the [facility] in [place]

## 2022-04-19 NOTE — Progress Notes (Signed)
Rx sent in for tarina 1-20

## 2022-07-18 IMAGING — MG MM DIGITAL SCREENING BILAT W/ TOMO AND CAD
8 series · 8 of 24 positions shown · non-contrast
Comparison: Previous exam(s).

ACR Breast Density Category a: The breast tissue is almost entirely
fatty.

CLINICAL DATA: Screening.

EXAM:
DIGITAL SCREENING BILATERAL MAMMOGRAM WITH TOMOSYNTHESIS AND CAD
TECHNIQUE: Bilateral screening digital craniocaudal and mediolateral oblique
mammograms were obtained. Bilateral screening digital breast
tomosynthesis was performed. The images were evaluated with
computer-aided detection.

[R MLO synth-2D]
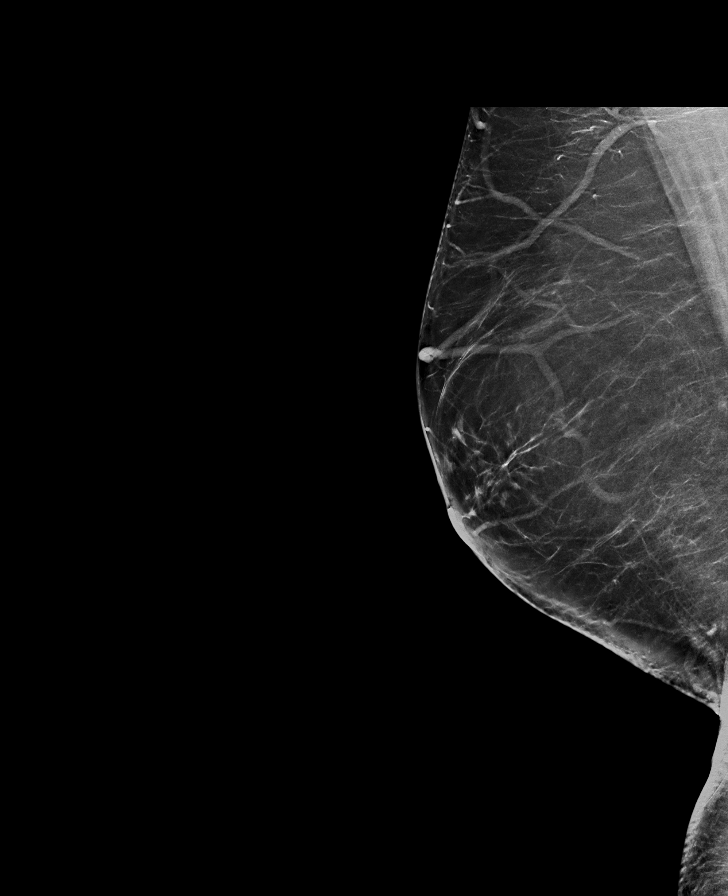

[L CC synth-2D]
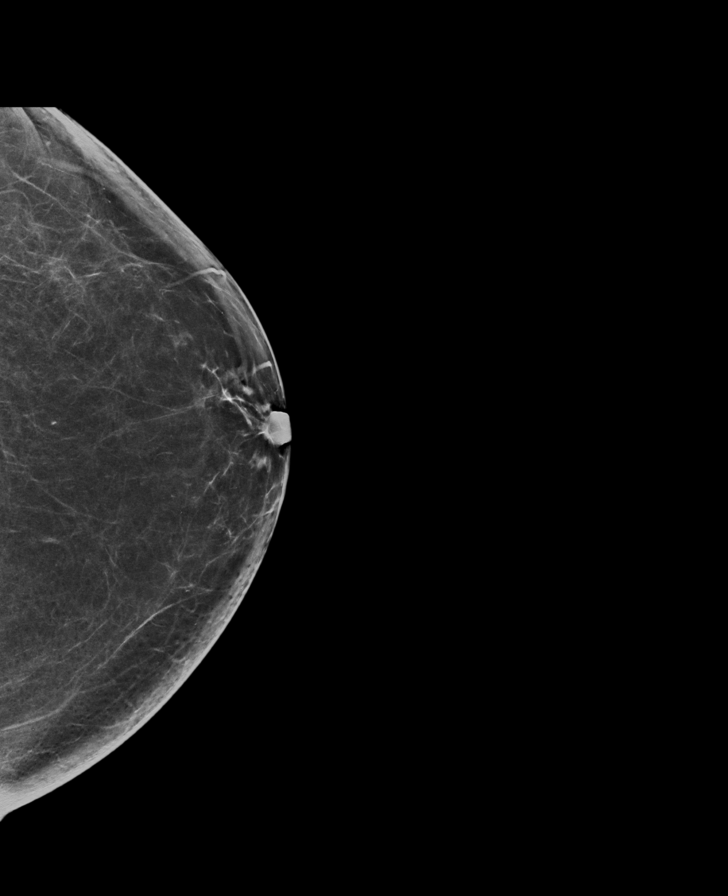

[R CC synth-2D]
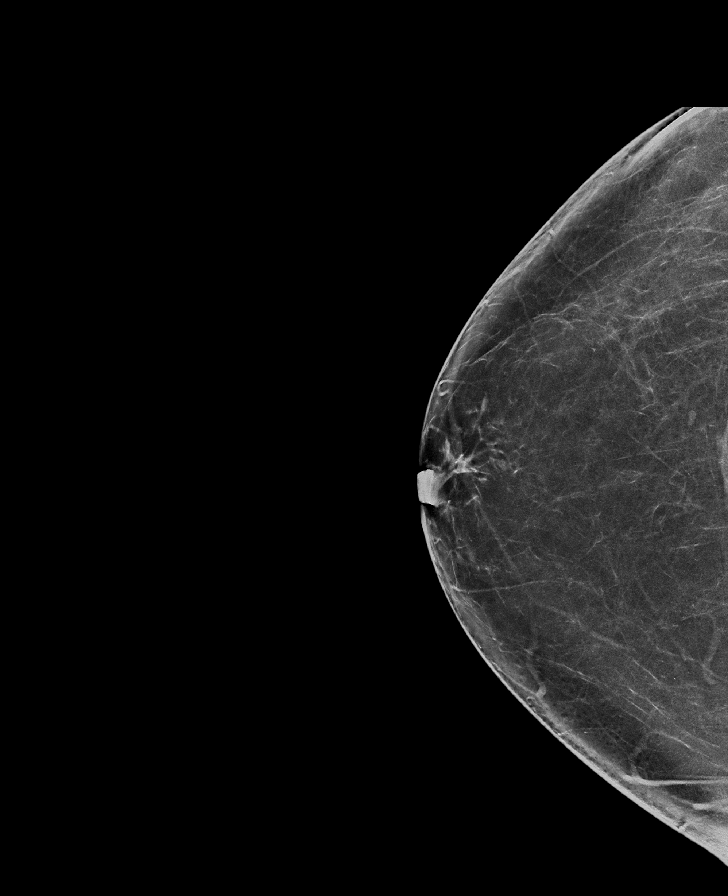

[L MLO synth-2D]
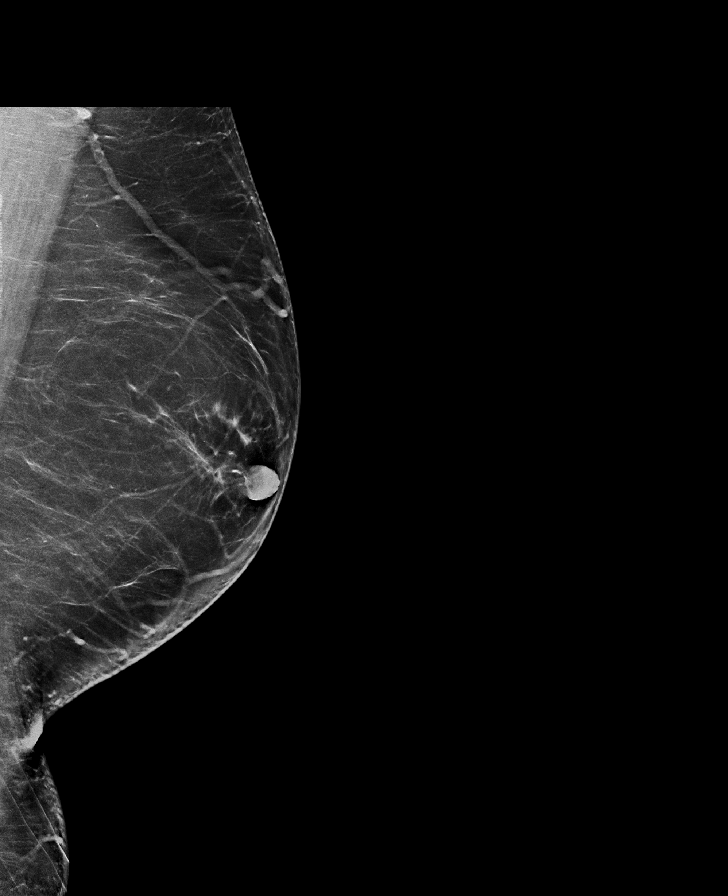

[L CC tomo · tomo slice 39/77.0]
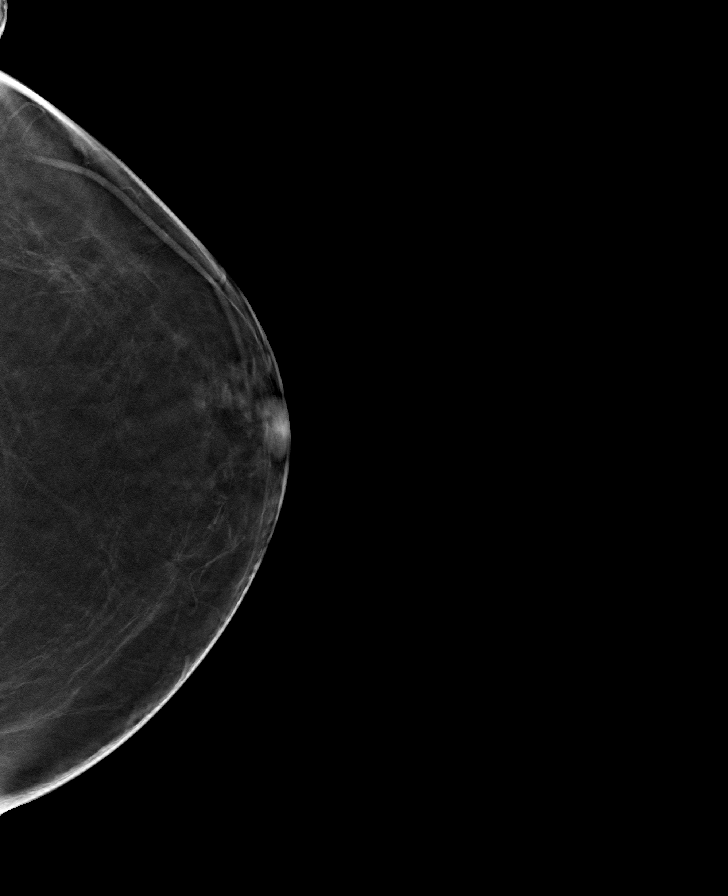

[R CC tomo · tomo slice 39/77.0]
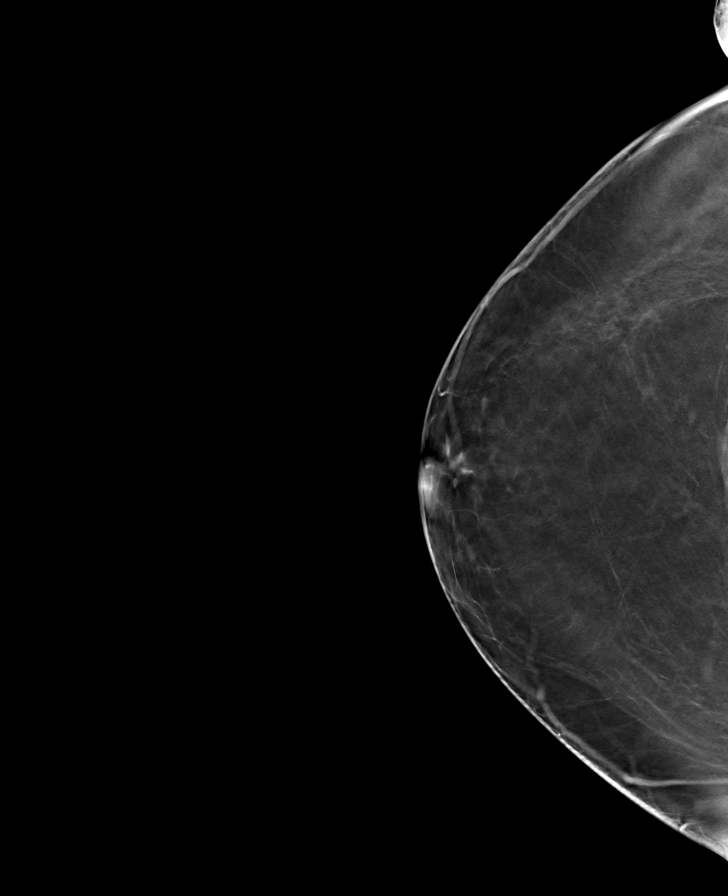

[R MLO tomo · tomo slice 42/83.0]
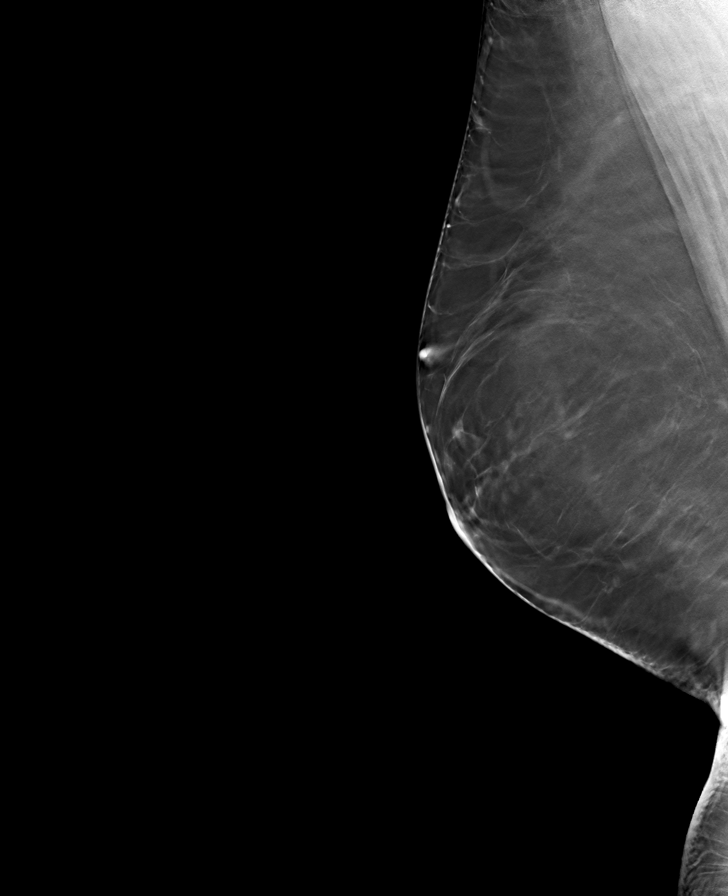

[L MLO tomo · tomo slice 41/81.0]
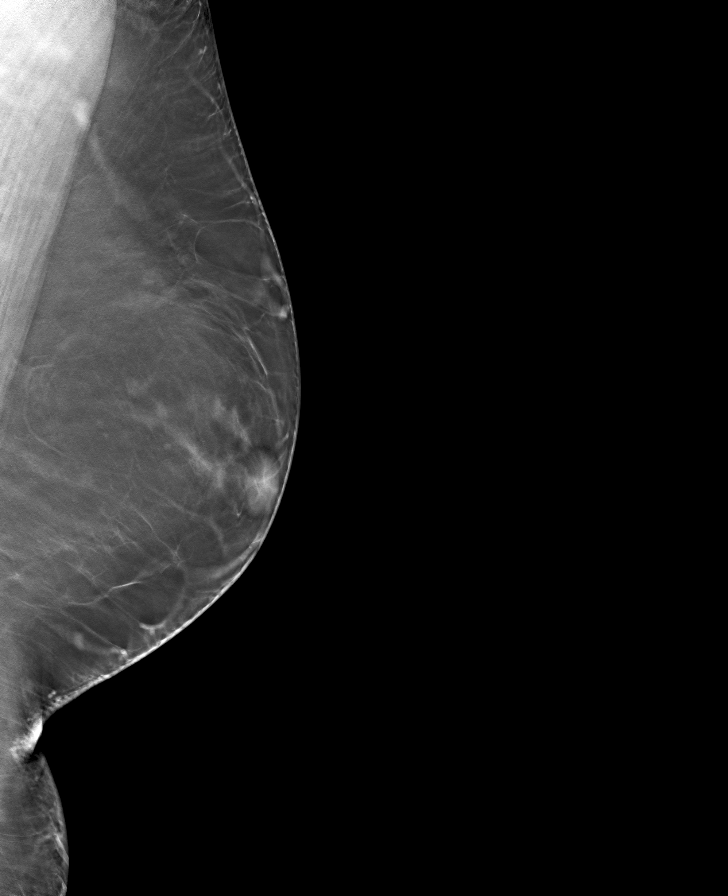

[8 of 24 positions shown; findings below may reference images not displayed]

FINDINGS: There are no findings suspicious for malignancy.
IMPRESSION: No mammographic evidence of malignancy. A result letter of this
screening mammogram will be mailed directly to the patient.

RECOMMENDATION:
Screening mammogram in one year. (Code:0E-3-N98)

BI-RADS CATEGORY  1: Negative.

## 2022-09-22 ENCOUNTER — Other Ambulatory Visit (HOSPITAL_COMMUNITY): Payer: Self-pay | Admitting: Obstetrics & Gynecology

## 2022-09-22 DIAGNOSIS — Z1231 Encounter for screening mammogram for malignant neoplasm of breast: Secondary | ICD-10-CM

## 2022-11-17 ENCOUNTER — Other Ambulatory Visit (HOSPITAL_COMMUNITY): Payer: Self-pay | Admitting: Adult Health

## 2022-11-17 ENCOUNTER — Ambulatory Visit: Payer: No Typology Code available for payment source | Admitting: Adult Health

## 2022-11-17 ENCOUNTER — Other Ambulatory Visit (HOSPITAL_COMMUNITY)
Admission: RE | Admit: 2022-11-17 | Discharge: 2022-11-17 | Disposition: A | Discharge status: Home / Self Care | Payer: No Typology Code available for payment source | Source: Ambulatory Visit | Attending: Adult Health | Admitting: Adult Health

## 2022-11-17 ENCOUNTER — Encounter: Payer: Self-pay | Admitting: Adult Health

## 2022-11-17 ENCOUNTER — Ambulatory Visit (HOSPITAL_COMMUNITY)
Admission: RE | Admit: 2022-11-17 | Discharge: 2022-11-17 | Disposition: A | Discharge status: Home / Self Care | Payer: No Typology Code available for payment source | Source: Ambulatory Visit | Attending: Obstetrics & Gynecology | Admitting: Obstetrics & Gynecology

## 2022-11-17 VITALS — BP 120/72 | HR 82 | Ht 63.0 in | Wt 223.0 lb

## 2022-11-17 DIAGNOSIS — Z1339 Encounter for screening examination for other mental health and behavioral disorders: Secondary | ICD-10-CM

## 2022-11-17 DIAGNOSIS — D280 Benign neoplasm of vulva: Secondary | ICD-10-CM

## 2022-11-17 DIAGNOSIS — Z1231 Encounter for screening mammogram for malignant neoplasm of breast: Secondary | ICD-10-CM

## 2022-11-17 DIAGNOSIS — Z1211 Encounter for screening for malignant neoplasm of colon: Secondary | ICD-10-CM | POA: Diagnosis not present

## 2022-11-17 DIAGNOSIS — Z01419 Encounter for gynecological examination (general) (routine) without abnormal findings: Secondary | ICD-10-CM | POA: Insufficient documentation

## 2022-11-17 DIAGNOSIS — Z3041 Encounter for surveillance of contraceptive pills: Secondary | ICD-10-CM | POA: Diagnosis not present

## 2022-11-17 LAB — HEMOCCULT GUIAC POC 1CARD (OFFICE): Fecal Occult Blood, POC: NEGATIVE

## 2022-11-17 MED ORDER — NORETHIN ACE-ETH ESTRAD-FE 1-20 MG-MCG PO TABS: ORAL_TABLET | ORAL | 4 refills | Status: DC

## 2022-11-17 NOTE — Progress Notes (Signed)
Patient ID: Anne [name], female   DOB: 1980-08-18, 42 y.o.   MRN: [id number] History of Present Illness: Anne [name] is a 42 year old white female,single, G0P0, in for a well woman gyn exam and pap. She has noticed dark spots in vaginal area.  PCP is Dr Phillips Odor.   Current Medications, Allergies, Past Medical History, Past Surgical History, Family History and Social History were reviewed in [employer] record.     Review of Systems: Patient denies any headaches, hearing loss, fatigue, blurred vision, shortness of breath, chest pain, abdominal pain, problems with bowel movements, urination, or intercourse. No joint pain or mood swings.  See HPI for positives    Physical Exam:BP 120/72 (BP Location: Left Arm, Patient Position: Sitting, Cuff Size: Normal)   Pulse 82   Ht 5\' 3"  (1.6 m)   Wt 223 lb (101.2 kg)   LMP 10/27/2022 (Approximate)   BMI 39.50 kg/m   General:  Well developed, well nourished, no acute distress Skin:  Warm and dry Neck:  Midline trachea, normal thyroid, good ROM, no lymphadenopathy Lungs; Clear to auscultation bilaterally Breast:  No dominant palpable mass, retraction, or nipple discharge Cardiovascular: Regular rate and rhythm Abdomen:  Soft, non tender, no hepatosplenomegaly Pelvic:  External genitalia is normal in appearance, has multiple angiokeratomas on vulva, L>R.   The vagina is normal in appearance. Urethra has no lesions or masses. The cervix is smooth, pap with HR HPV genotyping performed.   Uterus is felt to be normal size, shape, and contour.  No adnexal masses or tenderness noted.Bladder is non tender, no masses felt. Rectal: Good sphincter tone, no polyps, or hemorrhoids felt.  Hemoccult negative. Extremities/musculoskeletal:  No swelling or varicosities noted, no clubbing or cyanosis Psych:  No mood changes, alert and cooperative,seems happy AA is 0 Fall risk is low    11/17/2022   11:42 AM 10/07/2021   10:58 AM 10/06/2020     8:38 AM  Depression screen PHQ 2/9  Decreased Interest 2 1 2   Down, Depressed, Hopeless 2 1 2   PHQ - 2 Score 4 2 4   Altered sleeping 0 1 2  Tired, decreased energy 3 1 2   Change in appetite 0 0 2  Feeling bad or failure about yourself  1 0 2  Trouble concentrating 0 1 0  Moving slowly or fidgety/restless 0 0 0  Suicidal thoughts 0 0 0  PHQ-9 Score 8 5 12    She is on meds.     11/17/2022   11:43 AM 10/07/2021   10:58 AM 10/06/2020    8:38 AM 10/01/2019    8:45 AM  GAD 7 : Generalized Anxiety Score  Nervous, Anxious, on Edge 2 1 2 1   Control/stop worrying 2 0 2 1  Worry too much - different things 2 1 2 2   Trouble relaxing 2 0 2 2  Restless 0 0 2 0  Easily annoyed or irritable 2 0 2 2  Afraid - awful might happen 1 0 2 1  Total GAD 7 Score 11 2 14 9     Upstream - 11/17/22 1140       Pregnancy Intention Screening   Does the patient want to become pregnant in the next year? No    Does the patient's partner want to become pregnant in the next year? No    Would the patient like to discuss contraceptive options today? No      Contraception Wrap Up   Current Method Oral Contraceptive  End Method Oral Contraceptive    Contraception Counseling Provided Yes              Examination chaperoned by Malachy Mood LPN  Impression and Plan: 1. Encounter for gynecological examination with Papanicolaou smear of cervix Pap sent Pap in 3 years if normal Physical in 1 year Labs with PCP Getting mammogram today at [facility]  - Cytology - PAP( [place]) Has appt with PCP today.  2. Encounter for surveillance of contraceptive pills Happy with BCP will send refill Meds ordered this encounter  Medications   norethindrone-ethinyl estradiol-FE (TARINA FE 1/20 EQ) 1-20 MG-MCG tablet    Sig: TAKE ONE TABLET BY MOUTH DAILY. GENERIC EQUIVALENT TO BLISOVI FE    Dispense:  84 tablet    Refill:  4    Order Specific Question:   Supervising Provider    Answer:   Despina Hidden, LUTHER H [[id number]]      3. Encounter for screening fecal occult blood testing Hemoccult was negative  - POCT occult blood stool  4. Angiokeratoma of vulva Just leave alone, showed her pictures in [facility]

## 2022-11-22 ENCOUNTER — Other Ambulatory Visit (HOSPITAL_COMMUNITY): Payer: Self-pay | Admitting: Obstetrics & Gynecology

## 2022-11-22 DIAGNOSIS — R928 Other abnormal and inconclusive findings on diagnostic imaging of breast: Secondary | ICD-10-CM

## 2022-11-22 LAB — CYTOLOGY - PAP
Comment: NEGATIVE
Diagnosis: NEGATIVE
Diagnosis: REACTIVE
High risk HPV: NEGATIVE

## 2022-11-26 ENCOUNTER — Other Ambulatory Visit: Payer: Self-pay | Admitting: Medical Genetics

## 2022-11-26 DIAGNOSIS — Z006 Encounter for examination for normal comparison and control in clinical research program: Secondary | ICD-10-CM

## 2022-11-29 ENCOUNTER — Other Ambulatory Visit (HOSPITAL_COMMUNITY)
Admission: RE | Admit: 2022-11-29 | Discharge: 2022-11-29 | Disposition: A | Discharge status: Home / Self Care | Payer: No Typology Code available for payment source | Source: Ambulatory Visit | Attending: Oncology | Admitting: Oncology

## 2022-11-29 DIAGNOSIS — Z006 Encounter for examination for normal comparison and control in clinical research program: Secondary | ICD-10-CM | POA: Insufficient documentation

## 2022-12-07 LAB — GENECONNECT MOLECULAR SCREEN

## 2022-12-07 LAB — HELIX MOLECULAR SCREEN: Genetic Analysis Overall Interpretation: NEGATIVE

## 2022-12-28 ENCOUNTER — Encounter (HOSPITAL_COMMUNITY): Payer: Self-pay

## 2022-12-28 ENCOUNTER — Ambulatory Visit (HOSPITAL_COMMUNITY)
Admission: RE | Admit: 2022-12-28 | Discharge: 2022-12-28 | Disposition: A | Discharge status: Home / Self Care | Payer: No Typology Code available for payment source | Source: Ambulatory Visit | Attending: Obstetrics & Gynecology | Admitting: Obstetrics & Gynecology

## 2022-12-28 DIAGNOSIS — R928 Other abnormal and inconclusive findings on diagnostic imaging of breast: Secondary | ICD-10-CM | POA: Insufficient documentation

## 2023-01-20 ENCOUNTER — Other Ambulatory Visit: Payer: No Typology Code available for payment source | Admitting: *Deleted

## 2023-01-20 ENCOUNTER — Other Ambulatory Visit (HOSPITAL_COMMUNITY)
Admission: RE | Admit: 2023-01-20 | Discharge: 2023-01-20 | Disposition: A | Discharge status: Home / Self Care | Payer: No Typology Code available for payment source | Source: Ambulatory Visit | Attending: Obstetrics & Gynecology | Admitting: Obstetrics & Gynecology

## 2023-01-20 DIAGNOSIS — B3731 Acute candidiasis of vulva and vagina: Secondary | ICD-10-CM | POA: Diagnosis not present

## 2023-01-20 DIAGNOSIS — L299 Pruritus, unspecified: Secondary | ICD-10-CM

## 2023-01-20 DIAGNOSIS — L292 Pruritus vulvae: Secondary | ICD-10-CM | POA: Diagnosis not present

## 2023-01-20 DIAGNOSIS — R350 Frequency of micturition: Secondary | ICD-10-CM | POA: Diagnosis not present

## 2023-01-20 DIAGNOSIS — Z113 Encounter for screening for infections with a predominantly sexual mode of transmission: Secondary | ICD-10-CM | POA: Insufficient documentation

## 2023-01-20 LAB — POCT URINALYSIS DIPSTICK
Glucose, UA: NEGATIVE
Ketones, UA: NEGATIVE
Nitrite, UA: NEGATIVE
Protein, UA: NEGATIVE

## 2023-01-20 NOTE — Progress Notes (Signed)
   NURSE VISIT- VAGINITIS/STD  SUBJECTIVE:  Anne [name] is a 43 y.o. G0P0000 GYN patientfemale here for a vaginal swab for vaginitis screening, STD screen.  She reports the following symptoms:  itching  for few days. Pt also has pain in right side and urinary frequency X few days. Urine dip showed 3+ blood and trace of leuks.  Denies abnormal vaginal bleeding, significant pelvic pain, fever.   OBJECTIVE:  There were no vitals taken for this visit.  Appears well, in no apparent distress  ASSESSMENT: Vaginal swab for vaginitis screening & STD screening.   PLAN: Self-collected vaginal probe for Gonorrhea, Chlamydia, Trichomonas, Bacterial Vaginosis, Yeast sent to lab. Urine culture and u/a sent to lab.  Treatment: to be determined once results are received Follow-up as needed if symptoms persist/worsen, or new symptoms develop  Clarita Salt  01/20/2023 4:31 PM

## 2023-01-21 LAB — MICROSCOPIC EXAMINATION
Casts: NONE SEEN /[LPF]
Epithelial Cells (non renal): >10 /[HPF] — AB (ref 0–10)
RBC, Urine: NONE SEEN /[HPF] (ref 0–2)

## 2023-01-21 LAB — URINALYSIS, ROUTINE W REFLEX MICROSCOPIC
Bilirubin, UA: NEGATIVE
Glucose, UA: NEGATIVE
Ketones, UA: NEGATIVE
Nitrite, UA: NEGATIVE
Protein,UA: NEGATIVE
Specific Gravity, UA: 1.007 (ref 1.005–1.030)
Urobilinogen, Ur: 1 mg/dL (ref 0.2–1.0)
pH, UA: 6.5 (ref 5.0–7.5)

## 2023-01-24 ENCOUNTER — Other Ambulatory Visit: Payer: Self-pay | Admitting: Adult Health

## 2023-01-24 LAB — CERVICOVAGINAL ANCILLARY ONLY
Bacterial Vaginitis (gardnerella): NEGATIVE
Candida Glabrata: NEGATIVE
Candida Vaginitis: POSITIVE — AB
Chlamydia: NEGATIVE
Comment: NEGATIVE
Comment: NEGATIVE
Comment: NEGATIVE
Comment: NEGATIVE
Comment: NEGATIVE
Comment: NORMAL
Neisseria Gonorrhea: NEGATIVE
Trichomonas: NEGATIVE

## 2023-01-24 LAB — URINE CULTURE

## 2023-01-24 MED ORDER — FLUCONAZOLE 150 MG PO TABS: ORAL_TABLET | ORAL | 1 refills | Status: DC

## 2023-01-24 NOTE — Progress Notes (Signed)
 Vaginal swab +yeast, will rx diflucan

## 2023-03-08 ENCOUNTER — Encounter (HOSPITAL_COMMUNITY): Payer: Self-pay

## 2023-03-08 ENCOUNTER — Observation Stay (HOSPITAL_COMMUNITY)
Admission: EM | Admit: 2023-03-08 | Discharge: 2023-03-10 | Disposition: A | Discharge status: Home / Self Care | Payer: No Typology Code available for payment source | Attending: Internal Medicine | Admitting: Internal Medicine

## 2023-03-08 ENCOUNTER — Emergency Department (HOSPITAL_COMMUNITY): Payer: No Typology Code available for payment source

## 2023-03-08 ENCOUNTER — Other Ambulatory Visit: Payer: Self-pay

## 2023-03-08 DIAGNOSIS — E785 Hyperlipidemia, unspecified: Secondary | ICD-10-CM | POA: Insufficient documentation

## 2023-03-08 DIAGNOSIS — F419 Anxiety disorder, unspecified: Secondary | ICD-10-CM | POA: Insufficient documentation

## 2023-03-08 DIAGNOSIS — Z79899 Other long term (current) drug therapy: Secondary | ICD-10-CM | POA: Insufficient documentation

## 2023-03-08 DIAGNOSIS — T424X1A Poisoning by benzodiazepines, accidental (unintentional), initial encounter: Principal | ICD-10-CM | POA: Diagnosis present

## 2023-03-08 DIAGNOSIS — E039 Hypothyroidism, unspecified: Secondary | ICD-10-CM | POA: Insufficient documentation

## 2023-03-08 DIAGNOSIS — I10 Essential (primary) hypertension: Secondary | ICD-10-CM | POA: Diagnosis not present

## 2023-03-08 DIAGNOSIS — F32A Depression, unspecified: Secondary | ICD-10-CM | POA: Diagnosis not present

## 2023-03-08 DIAGNOSIS — T07XXXA Unspecified multiple injuries, initial encounter: Principal | ICD-10-CM

## 2023-03-08 DIAGNOSIS — T424X3A Poisoning by benzodiazepines, assault, initial encounter: Secondary | ICD-10-CM

## 2023-03-08 LAB — I-STAT VENOUS BLOOD GAS, ED
Acid-Base Excess: 2 mmol/L (ref 0.0–2.0)
Bicarbonate: 26.5 mmol/L (ref 20.0–28.0)
Calcium, Ion: 1.06 mmol/L — ABNORMAL LOW (ref 1.15–1.40)
HCT: 38 % (ref 36.0–46.0)
Hemoglobin: 12.9 g/dL (ref 12.0–15.0)
O2 Saturation: 76 %
Potassium: 3.6 mmol/L (ref 3.5–5.1)
Sodium: 137 mmol/L (ref 135–145)
TCO2: 28 mmol/L (ref 22–32)
pCO2, Ven: 39.3 mm[Hg] — ABNORMAL LOW (ref 44–60)
pH, Ven: 7.436 — ABNORMAL HIGH (ref 7.25–7.43)
pO2, Ven: 39 mm[Hg] (ref 32–45)

## 2023-03-08 LAB — COMPREHENSIVE METABOLIC PANEL
ALT: 18 U/L (ref 0–44)
AST: 24 U/L (ref 15–41)
Albumin: 3.2 g/dL — ABNORMAL LOW (ref 3.5–5.0)
Alkaline Phosphatase: 57 U/L (ref 38–126)
Anion gap: 13 (ref 5–15)
BUN: 12 mg/dL (ref 6–20)
CO2: 23 mmol/L (ref 22–32)
Calcium: 8.2 mg/dL — ABNORMAL LOW (ref 8.9–10.3)
Chloride: 100 mmol/L (ref 98–111)
Creatinine, Ser: 0.97 mg/dL (ref 0.44–1.00)
GFR, Estimated: >60 mL/min (ref >60)
Glucose, Bld: 135 mg/dL — ABNORMAL HIGH (ref 70–99)
Potassium: 3.6 mmol/L (ref 3.5–5.1)
Sodium: 136 mmol/L (ref 135–145)
Total Bilirubin: 1.4 mg/dL — ABNORMAL HIGH (ref 0.0–1.2)
Total Protein: 6.5 g/dL (ref 6.5–8.1)

## 2023-03-08 LAB — CBC WITH DIFFERENTIAL/PLATELET
Abs Immature Granulocytes: 0.06 10*3/uL (ref 0.00–0.07)
Basophils Absolute: 0.1 10*3/uL (ref 0.0–0.1)
Basophils Relative: 0 %
Eosinophils Absolute: 0.1 10*3/uL (ref 0.0–0.5)
Eosinophils Relative: 1 %
HCT: 38.5 % (ref 36.0–46.0)
Hemoglobin: 13 g/dL (ref 12.0–15.0)
Immature Granulocytes: 0 %
Lymphocytes Relative: 17 %
Lymphs Abs: 2.5 10*3/uL (ref 0.7–4.0)
MCH: 30.4 pg (ref 26.0–34.0)
MCHC: 33.8 g/dL (ref 30.0–36.0)
MCV: 90.2 fL (ref 80.0–100.0)
Monocytes Absolute: 0.7 10*3/uL (ref 0.1–1.0)
Monocytes Relative: 5 %
Neutro Abs: 10.9 10*3/uL — ABNORMAL HIGH (ref 1.7–7.7)
Neutrophils Relative %: 77 %
Platelets: 284 10*3/uL (ref 150–400)
RBC: 4.27 MIL/uL (ref 3.87–5.11)
RDW: 13 % (ref 11.5–15.5)
WBC: 14.4 10*3/uL — ABNORMAL HIGH (ref 4.0–10.5)
nRBC: 0 % (ref 0.0–0.2)

## 2023-03-08 LAB — I-STAT CHEM 8, ED
BUN: 15 mg/dL (ref 6–20)
Calcium, Ion: 1.05 mmol/L — ABNORMAL LOW (ref 1.15–1.40)
Chloride: 102 mmol/L (ref 98–111)
Creatinine, Ser: 1 mg/dL (ref 0.44–1.00)
Glucose, Bld: 132 mg/dL — ABNORMAL HIGH (ref 70–99)
HCT: 40 % (ref 36.0–46.0)
Hemoglobin: 13.6 g/dL (ref 12.0–15.0)
Potassium: 3.6 mmol/L (ref 3.5–5.1)
Sodium: 137 mmol/L (ref 135–145)
TCO2: 25 mmol/L (ref 22–32)

## 2023-03-08 LAB — URINALYSIS, ROUTINE W REFLEX MICROSCOPIC
Bilirubin Urine: NEGATIVE
Glucose, UA: NEGATIVE mg/dL
Hgb urine dipstick: NEGATIVE
Ketones, ur: NEGATIVE mg/dL
Leukocytes,Ua: NEGATIVE
Nitrite: NEGATIVE
Protein, ur: NEGATIVE mg/dL
Specific Gravity, Urine: >1.046 — ABNORMAL HIGH (ref 1.005–1.030)
pH: 6 (ref 5.0–8.0)

## 2023-03-08 LAB — SAMPLE TO BLOOD BANK

## 2023-03-08 LAB — MAGNESIUM: Magnesium: 2 mg/dL (ref 1.7–2.4)

## 2023-03-08 LAB — SALICYLATE LEVEL: Salicylate Lvl: <7 mg/dL — ABNORMAL LOW (ref 7.0–30.0)

## 2023-03-08 LAB — PROTIME-INR
INR: 1 (ref 0.8–1.2)
Prothrombin Time: 13.2 s (ref 11.4–15.2)

## 2023-03-08 LAB — RAPID URINE DRUG SCREEN, HOSP PERFORMED
Amphetamines: NOT DETECTED
Barbiturates: NOT DETECTED
Benzodiazepines: POSITIVE — AB
Cocaine: NOT DETECTED
Opiates: NOT DETECTED
Tetrahydrocannabinol: NOT DETECTED

## 2023-03-08 LAB — I-STAT CG4 LACTIC ACID, ED: Lactic Acid, Venous: 1.5 mmol/L (ref 0.5–1.9)

## 2023-03-08 LAB — ACETAMINOPHEN LEVEL: Acetaminophen (Tylenol), Serum: <10 ug/mL — ABNORMAL LOW (ref 10–30)

## 2023-03-08 LAB — HIV ANTIBODY (ROUTINE TESTING W REFLEX): HIV Screen 4th Generation wRfx: NONREACTIVE

## 2023-03-08 LAB — ETHANOL: Alcohol, Ethyl (B): <10 mg/dL (ref <10)

## 2023-03-08 MED ORDER — METHOCARBAMOL 500 MG PO TABS: 500.0000 mg | ORAL_TABLET | Freq: Three times a day (TID) | ORAL | Status: DC | PRN

## 2023-03-08 MED ORDER — ACETAMINOPHEN 325 MG PO TABS: 650.0000 mg | ORAL_TABLET | Freq: Four times a day (QID) | ORAL | Status: DC | PRN

## 2023-03-08 MED ORDER — ACETAMINOPHEN 650 MG RE SUPP: 650.0000 mg | Freq: Four times a day (QID) | RECTAL | Status: DC | PRN

## 2023-03-08 MED ORDER — AMLODIPINE BESYLATE 5 MG PO TABS
5.0000 mg | ORAL_TABLET | Freq: Every day | ORAL | Status: DC
  Administered 2023-03-09 – 2023-03-10 (×2): 5 mg via ORAL
  Filled 2023-03-08 (×2): qty 1

## 2023-03-08 MED ORDER — IOHEXOL 350 MG/ML SOLN
80.0000 mL | Freq: Once | INTRAVENOUS | Status: AC | PRN
  Administered 2023-03-08: 80 mL via INTRAVENOUS

## 2023-03-08 MED ORDER — LEVOTHYROXINE SODIUM 25 MCG PO TABS
125.0000 ug | ORAL_TABLET | Freq: Every day | ORAL | Status: DC
  Administered 2023-03-10: 125 ug via ORAL
  Filled 2023-03-08 (×2): qty 1

## 2023-03-08 MED ORDER — ATORVASTATIN CALCIUM 10 MG PO TABS
20.0000 mg | ORAL_TABLET | Freq: Every day | ORAL | Status: DC
Start: 2023-03-09 — End: 2023-03-10
  Administered 2023-03-09 – 2023-03-10 (×2): 20 mg via ORAL
  Filled 2023-03-08 (×2): qty 2

## 2023-03-08 MED ORDER — POLYETHYLENE GLYCOL 3350 17 G PO PACK: 17.0000 g | PACK | Freq: Every day | ORAL | Status: DC | PRN

## 2023-03-08 MED ORDER — FLUMAZENIL 0.5 MG/5ML IV SOLN: 0.2000 mg | INTRAVENOUS | Status: DC | PRN

## 2023-03-08 MED ORDER — SODIUM CHLORIDE 0.9% FLUSH
3.0000 mL | Freq: Two times a day (BID) | INTRAVENOUS | Status: DC
  Administered 2023-03-08 – 2023-03-10 (×4): 3 mL via INTRAVENOUS
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
## 2023-03-08 NOTE — Plan of Care (Signed)
  Problem: Activity: Goal: Risk for activity intolerance will decrease Outcome: Progressing   Problem: Coping: Goal: Level of anxiety will decrease Outcome: Progressing   Problem: Elimination: Goal: Will not experience complications related to urinary retention Outcome: Progressing   Problem: Safety: Goal: Ability to remain free from injury will improve Outcome: Progressing   

## 2023-03-08 NOTE — Progress Notes (Signed)
TRN spoke personally to pt's regional manager, Bancroft, at [employer] to notify her of pt being in hospital.  Per pt's request, I told her about her debit card needing to be placed on hold but did not allude to other personal information about pt's reasoning for being in the hospital. Kelton Pillar was very concerned about Shataya but very appreciative that I reached out.  Charity also asked to keep in touch to assure that if Tammi needs anything that she can do for her.   Pt is currently arriving to [facility], I will check on pt and let her know this information.

## 2023-03-08 NOTE — H&P (Signed)
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 status is judged to be reasonable and necessary in order to provide the required intensity of service to ensure the patient's safety. The patient's presenting symptoms, physical exam findings, and initial radiographic and laboratory data in the context of their medical condition is felt to place them at decreased risk for further clinical deterioration. Furthermore, it is anticipated that the patient will be medically stable for discharge from the hospital within 2 midnights of admission.    Synetta Fail MD [facility]  How to contact the [facility] Attending or Consulting provider 7A - 7P or covering provider during after hours 7P -7A, for this patient?   Check the care team in [facility] and look for a) attending/consulting [facility] provider listed and b) the [facility] team listed Log into [email or web address] and use [place]'s universal password to access. If you do not have the password, please contact the hospital operator. Locate the [facility] provider you are looking for under [facility]  and page to a number that you can be directly reached. If you still have difficulty reaching the provider, please page the [facility] (Director on Call) for the Hospitalists listed on amion for assistance.  03/08/2023, 1:19 PM

## 2023-03-08 NOTE — ED Notes (Signed)
Pt sleeping & with repositioning & HOB elevated her snoring decreased & her airway remains patent.
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
## 2023-03-08 NOTE — ED Notes (Signed)
 X-ray at bedside

## 2023-03-09 DIAGNOSIS — E785 Hyperlipidemia, unspecified: Secondary | ICD-10-CM | POA: Diagnosis not present

## 2023-03-09 DIAGNOSIS — T424X1A Poisoning by benzodiazepines, accidental (unintentional), initial encounter: Secondary | ICD-10-CM | POA: Diagnosis not present

## 2023-03-09 DIAGNOSIS — F419 Anxiety disorder, unspecified: Secondary | ICD-10-CM | POA: Diagnosis not present

## 2023-03-09 DIAGNOSIS — I10 Essential (primary) hypertension: Secondary | ICD-10-CM | POA: Diagnosis not present

## 2023-03-09 LAB — CBC
HCT: 35 % — ABNORMAL LOW (ref 36.0–46.0)
Hemoglobin: 11.8 g/dL — ABNORMAL LOW (ref 12.0–15.0)
MCH: 30.3 pg (ref 26.0–34.0)
MCHC: 33.7 g/dL (ref 30.0–36.0)
MCV: 90 fL (ref 80.0–100.0)
Platelets: 266 10*3/uL (ref 150–400)
RBC: 3.89 MIL/uL (ref 3.87–5.11)
RDW: 13.2 % (ref 11.5–15.5)
WBC: 10.8 10*3/uL — ABNORMAL HIGH (ref 4.0–10.5)
nRBC: 0 % (ref 0.0–0.2)

## 2023-03-09 LAB — COMPREHENSIVE METABOLIC PANEL
ALT: 18 U/L (ref 0–44)
AST: 20 U/L (ref 15–41)
Albumin: 2.9 g/dL — ABNORMAL LOW (ref 3.5–5.0)
Alkaline Phosphatase: 55 U/L (ref 38–126)
Anion gap: 10 (ref 5–15)
BUN: 12 mg/dL (ref 6–20)
CO2: 24 mmol/L (ref 22–32)
Calcium: 8 mg/dL — ABNORMAL LOW (ref 8.9–10.3)
Chloride: 104 mmol/L (ref 98–111)
Creatinine, Ser: 1.23 mg/dL — ABNORMAL HIGH (ref 0.44–1.00)
GFR, Estimated: 56 mL/min — ABNORMAL LOW (ref >60)
Glucose, Bld: 90 mg/dL (ref 70–99)
Potassium: 3.2 mmol/L — ABNORMAL LOW (ref 3.5–5.1)
Sodium: 138 mmol/L (ref 135–145)
Total Bilirubin: 1.3 mg/dL — ABNORMAL HIGH (ref 0.0–1.2)
Total Protein: 6.2 g/dL — ABNORMAL LOW (ref 6.5–8.1)

## 2023-03-09 LAB — TSH: TSH: 2.624 u[IU]/mL (ref 0.350–4.500)

## 2023-03-09 MED ORDER — POTASSIUM CHLORIDE CRYS ER 20 MEQ PO TBCR
40.0000 meq | EXTENDED_RELEASE_TABLET | Freq: Once | ORAL | Status: AC
  Administered 2023-03-09: 40 meq via ORAL
  Filled 2023-03-09: qty 2

## 2023-03-09 MED ORDER — ESCITALOPRAM OXALATE 20 MG PO TABS
20.0000 mg | ORAL_TABLET | Freq: Every day | ORAL | Status: DC
  Administered 2023-03-10: 20 mg via ORAL
  Filled 2023-03-09: qty 1

## 2023-03-09 MED ORDER — BUPROPION HCL ER (XL) 150 MG PO TB24
300.0000 mg | ORAL_TABLET | Freq: Every day | ORAL | Status: DC
  Administered 2023-03-10: 300 mg via ORAL
  Filled 2023-03-09: qty 2

## 2023-03-09 MED ORDER — HYDROXYZINE HCL 25 MG PO TABS: 25.0000 mg | ORAL_TABLET | Freq: Three times a day (TID) | ORAL | Status: DC | PRN

## 2023-03-09 MED ORDER — ENOXAPARIN SODIUM 40 MG/0.4ML IJ SOSY
40.0000 mg | PREFILLED_SYRINGE | Freq: Two times a day (BID) | INTRAMUSCULAR | Status: DC
  Administered 2023-03-09 – 2023-03-10 (×2): 40 mg via SUBCUTANEOUS
  Filled 2023-03-09 (×2): qty 0.4

## 2023-03-09 MED ORDER — TRAZODONE HCL 50 MG PO TABS: 50.0000 mg | ORAL_TABLET | Freq: Every evening | ORAL | Status: DC | PRN

## 2023-03-09 NOTE — Plan of Care (Signed)
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
## 2023-03-09 NOTE — Plan of Care (Signed)
  Problem: Health Behavior/Discharge Planning: Goal: Ability to manage health-related needs will improve Outcome: Progressing   Problem: Nutrition: Goal: Adequate nutrition will be maintained Outcome: Progressing   Problem: Coping: Goal: Level of anxiety will decrease Outcome: Progressing   Problem: Elimination: Goal: Will not experience complications related to urinary retention Outcome: Progressing   Problem: Pain Managment: Goal: General experience of comfort will improve and/or be controlled Outcome: Progressing
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
## 2023-03-10 ENCOUNTER — Other Ambulatory Visit (HOSPITAL_COMMUNITY): Payer: Self-pay

## 2023-03-10 DIAGNOSIS — T424X1A Poisoning by benzodiazepines, accidental (unintentional), initial encounter: Secondary | ICD-10-CM | POA: Diagnosis not present

## 2023-03-10 LAB — CBC
HCT: 36.9 % (ref 36.0–46.0)
Hemoglobin: 12.3 g/dL (ref 12.0–15.0)
MCH: 30.2 pg (ref 26.0–34.0)
MCHC: 33.3 g/dL (ref 30.0–36.0)
MCV: 90.7 fL (ref 80.0–100.0)
Platelets: 271 10*3/uL (ref 150–400)
RBC: 4.07 MIL/uL (ref 3.87–5.11)
RDW: 13.2 % (ref 11.5–15.5)
WBC: 8.7 10*3/uL (ref 4.0–10.5)
nRBC: 0 % (ref 0.0–0.2)

## 2023-03-10 LAB — BASIC METABOLIC PANEL
Anion gap: 11 (ref 5–15)
BUN: 12 mg/dL (ref 6–20)
CO2: 24 mmol/L (ref 22–32)
Calcium: 8.8 mg/dL — ABNORMAL LOW (ref 8.9–10.3)
Chloride: 103 mmol/L (ref 98–111)
Creatinine, Ser: 0.83 mg/dL (ref 0.44–1.00)
GFR, Estimated: >60 mL/min (ref >60)
Glucose, Bld: 109 mg/dL — ABNORMAL HIGH (ref 70–99)
Potassium: 3.5 mmol/L (ref 3.5–5.1)
Sodium: 138 mmol/L (ref 135–145)

## 2023-03-10 MED ORDER — TRAZODONE HCL 50 MG PO TABS
50.0000 mg | ORAL_TABLET | Freq: Every evening | ORAL | 0 refills | Status: AC | PRN
  Filled 2023-03-10: qty 30, 30d supply, fill #0

## 2023-03-10 MED ORDER — ESCITALOPRAM OXALATE 20 MG PO TABS
20.0000 mg | ORAL_TABLET | Freq: Every day | ORAL | 2 refills | Status: AC
  Filled 2023-03-10 – 2023-04-04 (×2): qty 30, 30d supply, fill #0
  Filled 2023-05-04: qty 30, 30d supply, fill #1

## 2023-03-10 MED ORDER — HYDROXYZINE HCL 25 MG PO TABS
25.0000 mg | ORAL_TABLET | Freq: Three times a day (TID) | ORAL | 0 refills | Status: AC | PRN
  Filled 2023-03-10: qty 90, 30d supply, fill #0
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
## 2023-03-10 NOTE — Progress Notes (Signed)
 Discharge instructions given. Patient verbalized understanding and all questions were answered.  ?

## 2023-03-10 NOTE — Discharge Summary (Signed)
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 mouth every Thursday.        Follow-up Information     Assunta Found, MD Follow up in 1 week(s).   Specialty: Family Medicine Contact information: [address] [place] [address] [phone]                  Time coordinating discharge: 39 minutes  Signed:  Girlie Veltri  [facility] 03/10/2023, 12:54 PM

## 2023-03-10 NOTE — Plan of Care (Signed)

## 2023-03-30 ENCOUNTER — Ambulatory Visit: Admitting: Adult Health

## 2023-03-30 ENCOUNTER — Encounter: Payer: Self-pay | Admitting: Adult Health

## 2023-03-30 VITALS — BP 118/72 | HR 80 | Ht 63.0 in | Wt 214.5 lb

## 2023-03-30 DIAGNOSIS — N898 Other specified noninflammatory disorders of vagina: Secondary | ICD-10-CM
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
## 2023-04-04 ENCOUNTER — Other Ambulatory Visit: Payer: Self-pay

## 2023-04-04 ENCOUNTER — Other Ambulatory Visit (HOSPITAL_COMMUNITY): Payer: Self-pay

## 2023-05-12 ENCOUNTER — Other Ambulatory Visit (HOSPITAL_COMMUNITY): Payer: Self-pay | Admitting: Adult Health

## 2023-05-12 DIAGNOSIS — N63 Unspecified lump in unspecified breast: Secondary | ICD-10-CM

## 2023-10-25 ENCOUNTER — Other Ambulatory Visit (HOSPITAL_COMMUNITY): Payer: Self-pay | Admitting: Adult Health

## 2023-10-25 DIAGNOSIS — N63 Unspecified lump in unspecified breast: Secondary | ICD-10-CM

## 2023-11-22 ENCOUNTER — Other Ambulatory Visit: Payer: Self-pay | Admitting: Adult Health

## 2023-12-01 ENCOUNTER — Other Ambulatory Visit: Payer: Self-pay | Admitting: Adult Health

## 2023-12-01 MED ORDER — NORETHINDRONE ACET-ETHINYL EST 1-20 MG-MCG PO TABS: 1.0000 | ORAL_TABLET | Freq: Every day | ORAL | 1 refills | Status: DC

## 2023-12-01 NOTE — Progress Notes (Signed)
Refilled Junel 1/20

## 2023-12-16 ENCOUNTER — Other Ambulatory Visit: Payer: Self-pay | Admitting: Adult Health

## 2023-12-29 ENCOUNTER — Encounter: Payer: Self-pay | Admitting: Obstetrics & Gynecology

## 2023-12-30 ENCOUNTER — Ambulatory Visit: Admitting: Obstetrics & Gynecology

## 2024-01-31 ENCOUNTER — Encounter (HOSPITAL_COMMUNITY): Payer: Self-pay

## 2024-01-31 ENCOUNTER — Ambulatory Visit (HOSPITAL_COMMUNITY)
Admission: RE | Admit: 2024-01-31 | Discharge: 2024-01-31 | Disposition: A | Source: Ambulatory Visit | Attending: Adult Health | Admitting: Adult Health

## 2024-01-31 ENCOUNTER — Ambulatory Visit (HOSPITAL_COMMUNITY)
Admission: RE | Admit: 2024-01-31 | Discharge: 2024-01-31 | Disposition: A | Discharge status: Home / Self Care | Source: Ambulatory Visit | Attending: Adult Health | Admitting: Adult Health

## 2024-01-31 DIAGNOSIS — N63 Unspecified lump in unspecified breast: Secondary | ICD-10-CM | POA: Insufficient documentation

## 2024-02-01 ENCOUNTER — Ambulatory Visit: Payer: Self-pay | Admitting: Adult Health

## 2024-02-20 ENCOUNTER — Ambulatory Visit: Admitting: Obstetrics & Gynecology

## 2024-02-20 ENCOUNTER — Encounter: Payer: Self-pay | Admitting: *Deleted

## 2024-03-27 ENCOUNTER — Ambulatory Visit: Admitting: Obstetrics & Gynecology
